# Patient Record
Sex: Female | Born: 1990 | Race: Black or African American | Hispanic: No | Marital: Married | Smoking: Former smoker
Health system: Southern US, Community
[De-identification: ages and names within clinical notes are randomized; demographics above are authoritative.]

---

## 2021-04-25 ENCOUNTER — Emergency Department (HOSPITAL_BASED_OUTPATIENT_CLINIC_OR_DEPARTMENT_OTHER): Payer: Medicaid Other

## 2021-04-25 ENCOUNTER — Other Ambulatory Visit: Payer: Self-pay

## 2021-04-25 ENCOUNTER — Emergency Department (HOSPITAL_BASED_OUTPATIENT_CLINIC_OR_DEPARTMENT_OTHER)
Admission: EM | Admit: 2021-04-25 | Discharge: 2021-04-25 | Disposition: A | Discharge status: Home / Self Care | Payer: Medicaid Other | Attending: Emergency Medicine | Admitting: Emergency Medicine

## 2021-04-25 ENCOUNTER — Encounter (HOSPITAL_BASED_OUTPATIENT_CLINIC_OR_DEPARTMENT_OTHER): Payer: Self-pay

## 2021-04-25 DIAGNOSIS — Z20822 Contact with and (suspected) exposure to covid-19: Secondary | ICD-10-CM | POA: Insufficient documentation

## 2021-04-25 DIAGNOSIS — N9489 Other specified conditions associated with female genital organs and menstrual cycle: Secondary | ICD-10-CM | POA: Diagnosis not present

## 2021-04-25 DIAGNOSIS — N760 Acute vaginitis: Secondary | ICD-10-CM | POA: Diagnosis not present

## 2021-04-25 DIAGNOSIS — B9689 Other specified bacterial agents as the cause of diseases classified elsewhere: Secondary | ICD-10-CM | POA: Insufficient documentation

## 2021-04-25 DIAGNOSIS — R000 Tachycardia, unspecified: Secondary | ICD-10-CM | POA: Insufficient documentation

## 2021-04-25 DIAGNOSIS — R1031 Right lower quadrant pain: Secondary | ICD-10-CM | POA: Diagnosis present

## 2021-04-25 LAB — URINALYSIS, ROUTINE W REFLEX MICROSCOPIC
Bilirubin Urine: NEGATIVE
Glucose, UA: NEGATIVE mg/dL
Hgb urine dipstick: NEGATIVE
Ketones, ur: 40 mg/dL — AB
Nitrite: NEGATIVE
Protein, ur: NEGATIVE mg/dL
Specific Gravity, Urine: 1.025 (ref 1.005–1.030)
pH: 6 (ref 5.0–8.0)

## 2021-04-25 LAB — CBC WITH DIFFERENTIAL/PLATELET
Abs Immature Granulocytes: 0.01 10*3/uL (ref 0.00–0.07)
Basophils Absolute: 0 10*3/uL (ref 0.0–0.1)
Basophils Relative: 0 %
Eosinophils Absolute: 0 10*3/uL (ref 0.0–0.5)
Eosinophils Relative: 0 %
HCT: 33.8 % — ABNORMAL LOW (ref 36.0–46.0)
Hemoglobin: 11.2 g/dL — ABNORMAL LOW (ref 12.0–15.0)
Immature Granulocytes: 0 %
Lymphocytes Relative: 31 %
Lymphs Abs: 1.8 10*3/uL (ref 0.7–4.0)
MCH: 30.2 pg (ref 26.0–34.0)
MCHC: 33.1 g/dL (ref 30.0–36.0)
MCV: 91.1 fL (ref 80.0–100.0)
Monocytes Absolute: 0.7 10*3/uL (ref 0.1–1.0)
Monocytes Relative: 11 %
Neutro Abs: 3.4 10*3/uL (ref 1.7–7.7)
Neutrophils Relative %: 58 %
Platelets: 259 10*3/uL (ref 150–400)
RBC: 3.71 MIL/uL — ABNORMAL LOW (ref 3.87–5.11)
RDW: 13.4 % (ref 11.5–15.5)
WBC: 5.9 10*3/uL (ref 4.0–10.5)
nRBC: 0 % (ref 0.0–0.2)

## 2021-04-25 LAB — LIPASE, BLOOD: Lipase: 24 U/L (ref 11–51)

## 2021-04-25 LAB — COMPREHENSIVE METABOLIC PANEL
ALT: 8 U/L (ref 0–44)
AST: 11 U/L — ABNORMAL LOW (ref 15–41)
Albumin: 3.4 g/dL — ABNORMAL LOW (ref 3.5–5.0)
Alkaline Phosphatase: 46 U/L (ref 38–126)
Anion gap: 8 (ref 5–15)
BUN: 10 mg/dL (ref 6–20)
CO2: 24 mmol/L (ref 22–32)
Calcium: 8.6 mg/dL — ABNORMAL LOW (ref 8.9–10.3)
Chloride: 105 mmol/L (ref 98–111)
Creatinine, Ser: 0.64 mg/dL (ref 0.44–1.00)
GFR, Estimated: >60 mL/min (ref >60)
Glucose, Bld: 90 mg/dL (ref 70–99)
Potassium: 3.8 mmol/L (ref 3.5–5.1)
Sodium: 137 mmol/L (ref 135–145)
Total Bilirubin: 0.7 mg/dL (ref 0.3–1.2)
Total Protein: 7.2 g/dL (ref 6.5–8.1)

## 2021-04-25 LAB — URINALYSIS, MICROSCOPIC (REFLEX)

## 2021-04-25 LAB — RESP PANEL BY RT-PCR (FLU A&B, COVID) ARPGX2
Influenza A by PCR: NEGATIVE
Influenza B by PCR: NEGATIVE
SARS Coronavirus 2 by RT PCR: NEGATIVE

## 2021-04-25 LAB — PREGNANCY, URINE: Preg Test, Ur: NEGATIVE

## 2021-04-25 MED ORDER — MORPHINE SULFATE (PF) 4 MG/ML IV SOLN
4.0000 mg | Freq: Once | INTRAVENOUS | Status: AC
  Administered 2021-04-25: 4 mg via INTRAVENOUS
  Filled 2021-04-25: qty 1

## 2021-04-25 MED ORDER — HYDROMORPHONE HCL 1 MG/ML IJ SOLN: 1.0000 mg | Freq: Once | INTRAMUSCULAR | Status: DC

## 2021-04-25 MED ORDER — IOHEXOL 300 MG/ML  SOLN
100.0000 mL | Freq: Once | INTRAMUSCULAR | Status: AC | PRN
  Administered 2021-04-25: 100 mL via INTRAVENOUS

## 2021-04-25 MED ORDER — SODIUM CHLORIDE 0.9 % IV BOLUS
1000.0000 mL | Freq: Once | INTRAVENOUS | Status: AC
  Administered 2021-04-25: 1000 mL via INTRAVENOUS

## 2021-04-25 MED ORDER — HYDROCODONE-ACETAMINOPHEN 5-325 MG PO TABS
2.0000 | ORAL_TABLET | ORAL | 0 refills | Status: DC | PRN
Start: 2021-04-25 — End: 2021-06-13

## 2021-04-25 MED ORDER — ONDANSETRON HCL 4 MG/2ML IJ SOLN
4.0000 mg | Freq: Once | INTRAMUSCULAR | Status: AC
  Administered 2021-04-25: 4 mg via INTRAVENOUS
  Filled 2021-04-25: qty 2

## 2021-04-25 MED ORDER — METRONIDAZOLE 500 MG PO TABS: 500.0000 mg | ORAL_TABLET | Freq: Two times a day (BID) | ORAL | 0 refills | Status: AC

## 2021-04-25 NOTE — Discharge Instructions (Signed)
Your work-up today was reassuring.  He did have some signs of bacterial vaginosis in your work-up with OBG earlier today so I prescribed you an antibiotic, take it twice daily for 7 days. Take the Norco for severe abdominal pain, try to use it sparingly. Call your gynecologist tomorrow, tell them you were seen today in the ED and your CT showed pelvic congestion syndrome.  See if they want to reevaluate you or have any additional recommendations.

## 2021-04-25 NOTE — ED Notes (Signed)
Pt does not have to void as this time

## 2021-04-25 NOTE — ED Provider Notes (Signed)
[facility] EMERGENCY DEPARTMENT Provider Note   CSN: [id number] Arrival date & time: 04/25/21  1643     History  Chief Complaint  Patient presents with   Abdominal Pain    Robin Bradley is a 31 y.o. female.  HPI  Patient presents with right sided abdominal pain.  It started a week ago, it worsened severely yesterday.  It is constant, primarily to the right lower quadrant but sometimes to the right upper quadrant.  It feels sharp, there is associated nausea but no vomiting.  Moving worsens the pain, she states she has been unwilling to eat secondary to nausea.  Not vomiting, no diarrhea.  Last bowel movement was earlier today, no dysuria or hematuria.  Was seen earlier today by your OB gynecologist, she tested positive for clue cells but was not started on Flagyl.  Patient was seen [facility] at [facility], she had lab work-up done at that time which was unremarkable.  Patient was seen by her OB/GYN earlier this morning.  The eye OB/GYN advised to go to the ED for additional work-up given the pain is upper as well as lower.  She had a routine pelvic which is notable for clue cells.  Has not been started on antibiotics at this time.  Past surgical history is notable for 2 previous C-sections, denies any other surgeries to the abdomen.   Home Medications Prior to Admission medications   Medication Sig Start Date End Date Taking? Authorizing Provider  HYDROcodone-acetaminophen (NORCO/VICODIN) 5-325 MG tablet Take 2 tablets by mouth every 4 (four) hours as needed. 04/25/21  Yes Sherrill Raring, PA-C  metroNIDAZOLE (FLAGYL) 500 MG tablet Take 1 tablet (500 mg total) by mouth 2 (two) times daily. 04/25/21  Yes Sherrill Raring, PA-C      Allergies    Patient has no known allergies.    Review of Systems   Review of Systems  Constitutional:  Negative for fever.  Respiratory:  Positive for shortness of breath.   Cardiovascular:  Negative for chest pain.  Gastrointestinal:   Positive for abdominal pain, constipation and nausea. Negative for vomiting.  Genitourinary:  Negative for dysuria, flank pain, vaginal discharge and vaginal pain.  Musculoskeletal:  Negative for gait problem.  Skin:  Negative for wound.  Neurological:  Negative for syncope.   Physical Exam Updated Vital Signs BP 105/63 (BP Location: Right Arm)    Pulse 65    Temp 99 F (37.2 C) (Oral)    Resp 18    Ht 5\' 1"  (1.549 m)    Wt 77.6 kg    LMP 03/31/2021    SpO2 100%    BMI 32.31 kg/m  Physical Exam Vitals and nursing note reviewed. Exam conducted with a chaperone present.  Constitutional:      Appearance: Normal appearance.  HENT:     Head: Normocephalic and atraumatic.  Eyes:     General: No scleral icterus.       Right eye: No discharge.        Left eye: No discharge.     Extraocular Movements: Extraocular movements intact.     Pupils: Pupils are equal, round, and reactive to light.  Cardiovascular:     Rate and Rhythm: Regular rhythm. Tachycardia present.     Pulses: Normal pulses.     Heart sounds: Normal heart sounds. No murmur heard.   No friction rub. No gallop.     Comments: S1-S2, mildly tachycardic Pulmonary:     Effort: Pulmonary effort is  normal. No respiratory distress.     Breath sounds: Normal breath sounds.  Abdominal:     General: Abdomen is flat. Bowel sounds are normal. There is no distension.     Palpations: Abdomen is soft.     Tenderness: There is abdominal tenderness in the right upper quadrant and right lower quadrant. There is rebound. There is no right CVA tenderness or left CVA tenderness. Positive signs include McBurney's sign. Negative signs include Murphy's sign.     Comments: Abdomen is soft, right lower quadrant tenderness as well as rebound tenderness with palpation to the left lower quadrant.  No CVA tenderness, slight right upper quadrant tenderness but negative Murphy.  Skin:    General: Skin is warm and dry.     Coloration: Skin is not  jaundiced.  Neurological:     Mental Status: She is alert. Mental status is at baseline.     Coordination: Coordination normal.   ED Results / Procedures / Treatments   Labs (all labs ordered are listed, but only abnormal results are displayed) Labs Reviewed  URINALYSIS, ROUTINE W REFLEX MICROSCOPIC - Abnormal; Notable for the following components:      Result Value   Ketones, ur 40 (*)    Leukocytes,Ua TRACE (*)    All other components within normal limits  COMPREHENSIVE METABOLIC PANEL - Abnormal; Notable for the following components:   Calcium 8.6 (*)    Albumin 3.4 (*)    AST 11 (*)    All other components within normal limits  CBC WITH DIFFERENTIAL/PLATELET - Abnormal; Notable for the following components:   RBC 3.71 (*)    Hemoglobin 11.2 (*)    HCT 33.8 (*)    All other components within normal limits  URINALYSIS, MICROSCOPIC (REFLEX) - Abnormal; Notable for the following components:   Bacteria, UA FEW (*)    All other components within normal limits  RESP PANEL BY RT-PCR (FLU A&B, COVID) ARPGX2  PREGNANCY, URINE  LIPASE, BLOOD    EKG None  Radiology CT ABDOMEN PELVIS W CONTRAST  Result Date: 04/25/2021 CLINICAL DATA:  Right lower quadrant abdominal pain beginning 1 week ago. EXAM: CT ABDOMEN AND PELVIS WITH CONTRAST TECHNIQUE: Multidetector CT imaging of the abdomen and pelvis was performed using the standard protocol following bolus administration of intravenous contrast. CONTRAST:  136mL OMNIPAQUE IOHEXOL 300 MG/ML  SOLN COMPARISON:  None. FINDINGS: Lower chest: Small right pleural effusion with mild right base atelectasis, etiology unknown. Hepatobiliary: Normal Pancreas: Normal Spleen: Normal Adrenals/Urinary Tract: Adrenal glands are normal. Kidneys are normal. Bladder is normal. Stomach/Bowel: Stomach and small intestine are normal. The appendix is normal. No colon abnormality seen. Vascular/Lymphatic: No abnormal vascular finding.  No adenopathy. Reproductive:  Uterus appears normal. Prominent vascularity of the ovaries, particularly on the right, suggesting possible pelvic congestion syndrome. Other: Small amount of free fluid in the pelvic cul-de-sac, often seen in females of this age. Musculoskeletal: Normal IMPRESSION: Normal appearing appendix. Prominent vascularity of the adnexal regions, particularly the right, suggesting pelvic congestion syndrome. Small right pleural effusion with mild right base atelectasis, etiology unknown. Electronically Signed   By: Nelson Chimes M.D.   On: 04/25/2021 18:45    Procedures Procedures    Medications Ordered in ED Medications  sodium chloride 0.9 % bolus 1,000 mL (0 mLs Intravenous Stopped 04/25/21 1858)  ondansetron (ZOFRAN) injection 4 mg (4 mg Intravenous Given 04/25/21 1734)  morphine 4 MG/ML injection 4 mg (4 mg Intravenous Given 04/25/21 1734)  iohexol (OMNIPAQUE) 300 MG/ML  solution 100 mL (100 mLs Intravenous Contrast Given 04/25/21 1821)  morphine 4 MG/ML injection 4 mg (4 mg Intravenous Given 04/25/21 1857)    ED Course/ Medical Decision Making/ A&P                           Medical Decision Making  This is a 31 year old female presenting with right-sided abdominal pain.  She is not technically febrile but has an elevated temperature at 99 degrees normal.  She has right lower quadrant tenderness as well as rebound tenderness concerning for possible appendicitis.  We will keep n.p.o. and proceed with CT.   Additional History and Prior Lab per chart review: Patient's ED visit yesterday was reviewed on care everywhere, I cannot see the provider note but I can see the lab work-up.  There is no AKI or gross electrolyte derangement.  Her potassium was slightly low at 3.4 but not clinically pertinent.  She was not pregnant, no evidence of a UTI on her urinalysis.  Additionally, there is no evidence of a leukocytosis on the CBC.  Patient work-up: There is no leukocytosis on CBC, patient is mildly anemic at  11.2 but stable.  Additionally, no signs of hypotension or tachycardia concerning for acute blood loss. CMP: Hypokalemia as yesterday, mild hypocalcemia but not clinically pertinent or contributable to her current presentation.  There is no evidence of AKI, no LFT elevation concerning for an acute hepatic injury or cholestatic injury. Lipase: Negative, additionally patient presentation is not consistent with pancreatitis. UA: No evidence of a UTI Not preg  Imaging: CT ABD/PELVIS with contrast. -Imaging results reviewed by myself personally and I agree with the radiologist impression.. -CT abdomen pelvis is notable for  Normal appearing appendix.     Prominent vascularity of the adnexal regions, particularly the  right, suggesting pelvic congestion syndrome.     Small right pleural effusion with mild right base atelectasis,  etiology unknown.   Patient's lungs were clear to auscultation.  Her vitals are stable, she does feel short of breath secondary to pain but it does not appear to be in dependent shortness of breath.  Do not feel she needs isolated chest x-ray at this time.  Discussed the results with the patient including the suggestion of pelvic congestion syndrome.  Have advised her to follow-up with her gynecologist.  She verbalized understanding and will call them tomorrow morning.  I will prescribe her Flagyl given the clue cells on her wet prep earlier today, will also discharge with short course of narcotics for pain.  Do not feel she needs emergent ultrasound at this time.  Her pain has improved, her vitals remained stable.  Patient was discharged in stable condition.        Final Clinical Impression(s) / ED Diagnoses Final diagnoses:  BV (bacterial vaginosis)  Pelvic congestion syndrome    Rx / DC Orders ED Discharge Orders          Ordered    metroNIDAZOLE (FLAGYL) 500 MG tablet  2 times daily        04/25/21 1937    HYDROcodone-acetaminophen (NORCO/VICODIN) 5-325  MG tablet  Every 4 hours PRN        04/25/21 1937              Sherrill Raring, Hershal Coria 04/25/21 1941    Gareth Morgan, MD 04/26/21 1124

## 2021-04-25 NOTE — ED Triage Notes (Signed)
Pt c/o right side abd pain started 1/5-denies n/v/d-states she was seen/[facility] ED yesterday-NAD-steady gait

## 2021-06-13 ENCOUNTER — Telehealth (HOSPITAL_BASED_OUTPATIENT_CLINIC_OR_DEPARTMENT_OTHER): Payer: Self-pay | Admitting: Emergency Medicine

## 2021-06-13 ENCOUNTER — Other Ambulatory Visit: Payer: Self-pay

## 2021-06-13 ENCOUNTER — Emergency Department (HOSPITAL_BASED_OUTPATIENT_CLINIC_OR_DEPARTMENT_OTHER): Payer: Medicaid Other

## 2021-06-13 ENCOUNTER — Encounter (HOSPITAL_BASED_OUTPATIENT_CLINIC_OR_DEPARTMENT_OTHER): Payer: Self-pay

## 2021-06-13 ENCOUNTER — Emergency Department (HOSPITAL_BASED_OUTPATIENT_CLINIC_OR_DEPARTMENT_OTHER)
Admission: EM | Admit: 2021-06-13 | Discharge: 2021-06-13 | Disposition: A | Discharge status: Home / Self Care | Payer: Medicaid Other | Attending: Emergency Medicine | Admitting: Emergency Medicine

## 2021-06-13 DIAGNOSIS — R000 Tachycardia, unspecified: Secondary | ICD-10-CM | POA: Insufficient documentation

## 2021-06-13 DIAGNOSIS — M25552 Pain in left hip: Secondary | ICD-10-CM | POA: Diagnosis present

## 2021-06-13 MED ORDER — HYDROCODONE-ACETAMINOPHEN 5-325 MG PO TABS: 1.0000 | ORAL_TABLET | Freq: Four times a day (QID) | ORAL | 0 refills | Status: AC | PRN

## 2021-06-13 MED ORDER — NAPROXEN 375 MG PO TABS: 375.0000 mg | ORAL_TABLET | Freq: Two times a day (BID) | ORAL | 0 refills | Status: AC

## 2021-06-13 MED ORDER — HYDROCODONE-ACETAMINOPHEN 5-325 MG PO TABS: 1.0000 | ORAL_TABLET | Freq: Four times a day (QID) | ORAL | 0 refills | Status: DC | PRN

## 2021-06-13 MED ORDER — NAPROXEN 250 MG PO TABS
500.0000 mg | ORAL_TABLET | Freq: Once | ORAL | Status: AC
Start: 2021-06-13 — End: 2021-06-13
  Administered 2021-06-13: 500 mg via ORAL
  Filled 2021-06-13: qty 2

## 2021-06-13 MED ORDER — HYDROCODONE-ACETAMINOPHEN 5-325 MG PO TABS
1.0000 | ORAL_TABLET | Freq: Once | ORAL | Status: AC
Start: 2021-06-13 — End: 2021-06-13
  Administered 2021-06-13: 1 via ORAL
  Filled 2021-06-13: qty 1

## 2021-06-13 NOTE — Telephone Encounter (Signed)
Patient called back stating pharmacy she requested is out of medication. Requesting pharmacy change.  ?

## 2021-06-13 NOTE — ED Triage Notes (Signed)
Pt arrives with c/o pain to left hip denies any falls or injury. Reports hip is sore to touch and hurts with movement. States she noticed the pain 2 days ago and got worse yesterday. Has been using tylenol and heating pad.  ?

## 2021-06-13 NOTE — ED Provider Notes (Signed)
?[facility] EMERGENCY DEPARTMENT ?Provider Note ? ? ?CSN: [id number] ?Arrival date & time: 06/13/21  0754 ? ?  ? ?History ? ?Chief Complaint  ?Patient presents with  ? Hip Pain  ? ? ?Robin Bradley is a 31 y.o. female with no provided medical history.  Patient states that 2 days ago she began experiencing left hip pain that acutely worsened last night.  Patient denies any preceding trauma, event that could contribute to the pain.  Patient states that pain is constant and is described as a "pulled muscle, like I broke something.  It feels like it is throbbing".  Patient states that she has taken Tylenol with no relief of pain.  Patient denies history of blood clots, smoking, unilateral leg swelling or hemoptysis.  Patient denies radiation of pain into the left leg, back pain.  Patient denies fevers, nausea, vomiting. ? ? ?Hip Pain ? ? ?  ? ?Home Medications ?Prior to Admission medications   ?Medication Sig Start Date End Date Taking? Authorizing Provider  ?HYDROcodone-acetaminophen (NORCO/VICODIN) 5-325 MG tablet Take 1 tablet by mouth every 6 (six) hours as needed for moderate pain or severe pain. 06/13/21  Yes Al Decant, PA-C  ?naproxen (NAPROSYN) 375 MG tablet Take 1 tablet (375 mg total) by mouth 2 (two) times daily. 06/13/21  Yes Al Decant, PA-C  ?metroNIDAZOLE (FLAGYL) 500 MG tablet Take 1 tablet (500 mg total) by mouth 2 (two) times daily. 04/25/21   Theron Arista, PA-C  ?   ? ?Allergies    ?Patient has no known allergies.   ? ?Review of Systems   ?Review of Systems  ?Constitutional:  Negative for chills and fever.  ?Respiratory:  Negative for cough.   ?Cardiovascular:  Negative for leg swelling.  ?Gastrointestinal:  Negative for nausea and vomiting.  ?Musculoskeletal:  Positive for arthralgias. Negative for back pain.  ?All other systems reviewed and are negative. ? ?Physical Exam ?Updated Vital Signs ?BP 125/81 (BP Location: Right Arm)   Pulse (!) 111   Temp 97.8 ?F (36.6 ?C) (Oral)    Resp 18   Ht 5\' 1"  (1.549 m)   Wt 81.6 kg   LMP 06/13/2021   SpO2 99%   BMI 34.01 kg/m?  ?Physical Exam ?Vitals and nursing note reviewed.  ?Constitutional:   ?   General: She is not in acute distress. ?   Appearance: She is not ill-appearing, toxic-appearing or diaphoretic.  ?HENT:  ?   Head: Normocephalic and atraumatic.  ?   Nose: Nose normal.  ?   Mouth/Throat:  ?   Mouth: Mucous membranes are moist.  ?Eyes:  ?   Extraocular Movements: Extraocular movements intact.  ?   Conjunctiva/sclera: Conjunctivae normal.  ?   Pupils: Pupils are equal, round, and reactive to light.  ?Cardiovascular:  ?   Rate and Rhythm: Regular rhythm. Tachycardia present.  ?   Pulses:     ?     Dorsalis pedis pulses are 2+ on the right side and 2+ on the left side.  ?Pulmonary:  ?   Effort: Pulmonary effort is normal.  ?   Breath sounds: Normal breath sounds. No wheezing.  ?Abdominal:  ?   General: Abdomen is flat. There is no distension.  ?   Palpations: Abdomen is soft.  ?   Tenderness: There is no abdominal tenderness. There is no guarding.  ?Musculoskeletal:  ?   Cervical back: Normal range of motion and neck supple. No tenderness.  ?   Right hip:  Normal.  ?   Left hip: Tenderness present. No deformity or lacerations. Decreased range of motion.  ?   Right lower leg: No edema.  ?   Left lower leg: No edema.  ?   Comments: Diffuse tenderness over the lateral side of left hip.  There is no crepitus, deformity noted.  There is no overlying erythema to indicate cellulitis.  ?Skin: ?   General: Skin is warm and dry.  ?   Capillary Refill: Capillary refill takes less than 2 seconds.  ?Neurological:  ?   General: No focal deficit present.  ?   Mental Status: She is alert and oriented to person, place, and time.  ? ? ?ED Results / Procedures / Treatments   ?Labs ?(all labs ordered are listed, but only abnormal results are displayed) ?Labs Reviewed - No data to display ? ?EKG ?None ? ?Radiology ?DG Hip Unilat W or Wo Pelvis 2-3 Views  Left ? ?Result Date: 06/13/2021 ?CLINICAL DATA:  Left lateral hip pain for the past 3 days. EXAM: DG HIP (WITH OR WITHOUT PELVIS) 2-3V LEFT COMPARISON:  None. FINDINGS: There is no evidence of hip fracture or dislocation. There is no evidence of arthropathy or other focal bone abnormality. Small round calcification adjacent to the greater tuberosity. IMPRESSION: 1. No acute osseous abnormality or significant degenerative changes. 2. 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 Electronically Signed   By: Obie Dredge M.D.   On: 06/13/2021 08:33   ? ?Procedures ?Procedures  ? ? ?Medications Ordered in ED ?Medications  ?naproxen (NAPROSYN) tablet 500 mg (500 mg Oral Given 06/13/21 0959)  ?HYDROcodone-acetaminophen (NORCO/VICODIN) 5-325 MG per tablet 1 tablet (1 tablet Oral Given 06/13/21 0959)  ? ? ?ED Course/ Medical Decision Making/ A&P ?  ?                        ?Medical Decision Making ?Amount and/or Complexity of Data Reviewed ?Radiology: ordered. ? ?Risk ?Prescription drug management. ? ? ?31 year old female presents to ED for evaluation of left hip pain for 2 days.  Patient denies any history of preceding trauma.  On examination, the patient's left leg is not shortened or rotated, low suspicion for hip fracture.  There is no overlying erythema to the skin of the left hip, low suspicion for cellulitis at this time.  Patient has no tenderness of the calf, lower leg so my suspicion for DVT is low at this time.  Patient has 2+ DP pulse, less than 2-second capillary refill to the left foot.  She has full range of motion to her left ankle as well as her left knee.  Patient has appropriate range of motion to left hip however it is extremely painful for her to range her hip. ? ?Plain film imaging of the left hip which I interpreted personally does show possible calcific tendinopathy of the greater tuberosity.  There is no sign of  dislocation, fracture.  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 ? ?I provided the patient with NSAID prescription as well as pain management prescription.  I have referred her to sports medicine, Dr. Jordan Likes and advised her to follow-up with him this week.  I have provided the patient with a work note in the event that she should feel her pain is too great and she is unable to work. ? ?The patient seems stable discharge  at this time.  I discussed the patient and her case with my attending Dr. Deretha Emory who is in agreement.  The patient has been provided with return precautions and she voiced understanding.  The patient had all of her questions answered to her satisfaction.  Patient is stable for discharge. ? ? ?Final Clinical Impression(s) / ED Diagnoses ?Final diagnoses:  ?Left hip pain  ? ? ?Rx / DC Orders ?ED Discharge Orders   ? ?      Ordered  ?  naproxen (NAPROSYN) 375 MG tablet  2 times daily       ? 06/13/21 1003  ?  HYDROcodone-acetaminophen (NORCO/VICODIN) 5-325 MG tablet  Every 6 hours PRN       ? 06/13/21 1003  ? ?  ?  ? ?  ? ? ?  ?Al Decant, PA-C ?06/13/21 1013 ? ?  ?Vanetta Mulders, MD ?06/15/21 1819 ? ?

## 2021-06-13 NOTE — Discharge Instructions (Addendum)
I believe the cause of your pain today is due to calcific tendinopathy. ? ?Return to ED with any new symptoms such as fevers, tender calf, leg swelling ?Please follow-up with Dr. Raeford Razor.  Please call and make an appointment today or at your earliest convenience ?Please pick up the prescription medications sent in ?Please do not drive or operate heavy machinery while taking pain medication ?You may use ice and heating pads to alleviate pain. ?I provided you with a work note in the event that your hip pain renders you unable to attend work ?

## 2021-06-24 ENCOUNTER — Ambulatory Visit: Payer: Medicaid Other | Admitting: Family Medicine

## 2021-07-02 ENCOUNTER — Ambulatory Visit: Payer: Medicaid Other | Admitting: Family Medicine

## 2022-02-02 ENCOUNTER — Encounter (HOSPITAL_BASED_OUTPATIENT_CLINIC_OR_DEPARTMENT_OTHER): Payer: Self-pay | Admitting: Emergency Medicine

## 2022-02-02 ENCOUNTER — Emergency Department (HOSPITAL_BASED_OUTPATIENT_CLINIC_OR_DEPARTMENT_OTHER)
Admission: EM | Admit: 2022-02-02 | Discharge: 2022-02-02 | Disposition: A | Discharge status: Home / Self Care | Payer: Medicaid Other | Attending: Emergency Medicine | Admitting: Emergency Medicine

## 2022-02-02 ENCOUNTER — Other Ambulatory Visit: Payer: Self-pay

## 2022-02-02 DIAGNOSIS — M5441 Lumbago with sciatica, right side: Secondary | ICD-10-CM

## 2022-02-02 DIAGNOSIS — X500XXA Overexertion from strenuous movement or load, initial encounter: Secondary | ICD-10-CM | POA: Diagnosis not present

## 2022-02-02 DIAGNOSIS — M545 Low back pain, unspecified: Secondary | ICD-10-CM | POA: Diagnosis present

## 2022-02-02 LAB — URINALYSIS, ROUTINE W REFLEX MICROSCOPIC
Bilirubin Urine: NEGATIVE
Glucose, UA: NEGATIVE mg/dL
Hgb urine dipstick: NEGATIVE
Ketones, ur: NEGATIVE mg/dL
Nitrite: NEGATIVE
Protein, ur: NEGATIVE mg/dL
Specific Gravity, Urine: 1.025 (ref 1.005–1.030)
pH: 6 (ref 5.0–8.0)

## 2022-02-02 LAB — URINALYSIS, MICROSCOPIC (REFLEX): RBC / HPF: NONE SEEN RBC/hpf (ref 0–5)

## 2022-02-02 LAB — PREGNANCY, URINE: Preg Test, Ur: NEGATIVE

## 2022-02-02 MED ORDER — METHYLPREDNISOLONE 4 MG PO TBPK: ORAL_TABLET | ORAL | 0 refills | Status: AC

## 2022-02-02 MED ORDER — CYCLOBENZAPRINE HCL 10 MG PO TABS
10.0000 mg | ORAL_TABLET | Freq: Two times a day (BID) | ORAL | 0 refills | Status: AC | PRN
Start: 2022-02-02 — End: ?

## 2022-02-02 MED ORDER — CYCLOBENZAPRINE HCL 10 MG PO TABS
10.0000 mg | ORAL_TABLET | Freq: Once | ORAL | Status: AC
  Administered 2022-02-02: 10 mg via ORAL
  Filled 2022-02-02: qty 1

## 2022-02-02 NOTE — ED Notes (Signed)
Back pain lower rt side of back . Denies any injury. Denies any dysuria

## 2022-02-02 NOTE — Discharge Instructions (Addendum)
Take Flexeril, muscle relaxant as prescribed.  This medication is sedating so do not use while driving or doing any other dangerous activities.  Do not mix with alcohol or drugs.  I have also prescribed you steroids, take first dose when you pick up your prescription.  Follow package instructions.  Recommend 1000 mg of Tylenol every 6 hours as needed for pain as well.  Once you complete your steroids it is okay to take ibuprofen 400 mg every 8 hours as needed for pain as well.

## 2022-02-02 NOTE — ED Provider Notes (Signed)
[facility] Provider Note   CSN: [id number] Arrival date & time: 02/02/22  0636     History  Chief Complaint  Patient presents with   Back Pain    Robin Bradley is a 31 y.o. female.  Patient here with right-sided lower back pain that started over the last few days.  Does a lot of repetitive action, lifting for work.  Has had some pain in her right buttock sometimes into the right thigh.  Denies any loss of bowel or bladder.  No difficulty urinating.  No vaginal bleeding or discharge.  No numbness or weakness.  Denies any fever, chills.  No other significant trauma.  Last menstrual cycles was a few weeks ago.  Denies any pain with urination.  No flank pain.  No history of kidney stones.  The history is provided by the patient.       Home Medications Prior to Admission medications   Medication Sig Start Date End Date Taking? Authorizing Provider  cyclobenzaprine (FLEXERIL) 10 MG tablet Take 1 tablet (10 mg total) by mouth 2 (two) times daily as needed for muscle spasms. 02/02/22  Yes Prayan Ulin, DO  methylPREDNISolone (MEDROL DOSEPAK) 4 MG TBPK tablet Follow package insert 02/02/22  Yes Lanasia Porras, DO  HYDROcodone-acetaminophen (NORCO/VICODIN) 5-325 MG tablet Take 1 tablet by mouth every 6 (six) hours as needed for severe pain. 06/13/21   Azucena Cecil, PA-C  metroNIDAZOLE (FLAGYL) 500 MG tablet Take 1 tablet (500 mg total) by mouth 2 (two) times daily. 04/25/21   Sherrill Raring, PA-C  naproxen (NAPROSYN) 375 MG tablet Take 1 tablet (375 mg total) by mouth 2 (two) times daily. 06/13/21   Azucena Cecil, PA-C      Allergies    Patient has no known allergies.    Review of Systems   Review of Systems  Physical Exam Updated Vital Signs  ED Triage Vitals  Enc Vitals Group     BP 02/02/22 0643 118/75     Pulse Rate 02/02/22 0643 (!) 103     Resp 02/02/22 0643 16     Temp 02/02/22 0643 98.3 F (36.8 C)     Temp Source 02/02/22  0643 Oral     SpO2 02/02/22 0643 100 %     Weight 02/02/22 0644 180 lb (81.6 kg)     Height 02/02/22 0644 5\' 1"  (1.549 m)     Head Circumference --      Peak Flow --      Pain Score 02/02/22 0643 10     Pain Loc --      Pain Edu? --      Excl. in [place]? --     Physical Exam Vitals and nursing note reviewed.  Constitutional:      General: She is not in acute distress.    Appearance: She is well-developed. She is not ill-appearing.  HENT:     Head: Normocephalic and atraumatic.     Nose: Nose normal.     Mouth/Throat:     Mouth: Mucous membranes are moist.  Eyes:     Extraocular Movements: Extraocular movements intact.     Conjunctiva/sclera: Conjunctivae normal.     Pupils: Pupils are equal, round, and reactive to light.  Cardiovascular:     Rate and Rhythm: Normal rate and regular rhythm.     Pulses: Normal pulses.     Heart sounds: Normal heart sounds. No murmur heard. Pulmonary:     Effort: Pulmonary effort is  normal. No respiratory distress.     Breath sounds: Normal breath sounds.  Abdominal:     Palpations: Abdomen is soft.     Tenderness: There is no abdominal tenderness.  Musculoskeletal:        General: Tenderness present. No swelling.     Cervical back: Normal range of motion and neck supple.     Comments: No midline spinal tenderness, tenderness to the paraspinal lumbar muscles on the right  Skin:    General: Skin is warm and dry.     Capillary Refill: Capillary refill takes less than 2 seconds.  Neurological:     General: No focal deficit present.     Mental Status: She is alert.     Sensory: No sensory deficit.     Motor: No weakness.     Comments: 5+ out of 5 strength throughout, normal sensation, no drift  Psychiatric:        Mood and Affect: Mood normal.     ED Results / Procedures / Treatments   Labs (all labs ordered are listed, but only abnormal results are displayed) Labs Reviewed  PREGNANCY, URINE  URINALYSIS, ROUTINE W REFLEX MICROSCOPIC     EKG None  Radiology No results found.  Procedures Procedures    Medications Ordered in ED Medications  cyclobenzaprine (FLEXERIL) tablet 10 mg (has no administration in time range)    ED Course/ Medical Decision Making/ A&P                           Medical Decision Making Amount and/or Complexity of Data Reviewed Labs: ordered.  Risk Prescription drug management.   Robin Bradley is here with right-sided low back pain.  Normal vitals.  No fever.  Differential diagnosis is likely sciatic pain.  I have no concern for cauda equina or epidural abscess.  There acute spinal injury or process.  Urinalysis was collected was negative for UTI per my review and interpretation.  Pregnancy test is negative.  Overall we will treat with Medrol Dosepak, Tylenol, Flexeril.  No symptoms that are concerning or significant trauma process.  No need for imaging at this time.  We will have her follow-up with primary care doctor.  Discharged in good condition.  This chart was dictated using voice recognition software.  Despite best efforts to proofread,  errors can occur which can change the documentation meaning.         Final Clinical Impression(s) / ED Diagnoses Final diagnoses:  Acute right-sided low back pain with right-sided sciatica    Rx / DC Orders ED Discharge Orders          Ordered    cyclobenzaprine (FLEXERIL) 10 MG tablet  2 times daily PRN        02/02/22 0720    methylPREDNISolone (MEDROL DOSEPAK) 4 MG TBPK tablet        02/02/22 0720              Virgina Norfolk, DO 02/02/22 0720

## 2022-02-02 NOTE — ED Triage Notes (Signed)
Patient c/o lower back pain that started yesterday, worsened this morning upon waking.  Patient denies trauma, falls, urinary symptoms, bowel/bladder loss.

## 2022-04-27 ENCOUNTER — Emergency Department (HOSPITAL_BASED_OUTPATIENT_CLINIC_OR_DEPARTMENT_OTHER)
Admission: EM | Admit: 2022-04-27 | Discharge: 2022-04-27 | Disposition: A | Discharge status: Home / Self Care | Payer: Medicaid Other | Attending: Emergency Medicine | Admitting: Emergency Medicine

## 2022-04-27 ENCOUNTER — Emergency Department (HOSPITAL_BASED_OUTPATIENT_CLINIC_OR_DEPARTMENT_OTHER): Payer: Medicaid Other

## 2022-04-27 ENCOUNTER — Encounter (HOSPITAL_BASED_OUTPATIENT_CLINIC_OR_DEPARTMENT_OTHER): Payer: Self-pay | Admitting: Emergency Medicine

## 2022-04-27 ENCOUNTER — Other Ambulatory Visit: Payer: Self-pay

## 2022-04-27 DIAGNOSIS — M25571 Pain in right ankle and joints of right foot: Secondary | ICD-10-CM | POA: Diagnosis present

## 2022-04-27 DIAGNOSIS — M7989 Other specified soft tissue disorders: Secondary | ICD-10-CM | POA: Insufficient documentation

## 2022-04-27 MED ORDER — IBUPROFEN 400 MG PO TABS
600.0000 mg | ORAL_TABLET | Freq: Once | ORAL | Status: AC
  Administered 2022-04-27: 600 mg via ORAL
  Filled 2022-04-27: qty 1

## 2022-04-27 MED ORDER — IBUPROFEN 600 MG PO TABS: 600.0000 mg | ORAL_TABLET | Freq: Four times a day (QID) | ORAL | 0 refills | Status: AC | PRN

## 2022-04-27 NOTE — Discharge Instructions (Addendum)
Today for right foot and ankle pain.  The area for pain suggest you might have Achilles tendinitis.  You are given an ankle boot and crutches.  Rest the area, use ice as directed and use the ibuprofen.  Follow-up with orthopedics.  Come back to the ER if you have new or worsening symptoms.

## 2022-04-27 NOTE — ED Triage Notes (Signed)
Pt arrives pov, limping gait with c/o RT heel and achilles pain upon waking today. Denies injury

## 2022-04-27 NOTE — ED Provider Notes (Signed)
[facility] Provider Note   CSN: [id number] Arrival date & time: 04/27/22  0849     History  Chief Complaint  Patient presents with   Foot Pain    Robin Bradley is a 32 y.o. female.  She complaining of pain to the left heel that started last night became more severe this morning.  She has no trauma.  She reports she is on her feet a lot for work.  She states she got up today and noticed some swelling to the bottom of her right heel and could not bear full weight.  She tried putting on elastic brace without relief.  She tried to get to work but was unable to finish and comes for further evaluation.  Never had this pain in the past.  Pain is most severe in the Achilles tendon, denies recent antibiotic use specifically recent fluoroquinolone use.   Foot Pain       Home Medications Prior to Admission medications   Medication Sig Start Date End Date Taking? Authorizing Provider  ibuprofen (ADVIL) 600 MG tablet Take 1 tablet (600 mg total) by mouth every 6 (six) hours as needed. 04/27/22  Yes Omair Dettmer A, PA-C  cyclobenzaprine (FLEXERIL) 10 MG tablet Take 1 tablet (10 mg total) by mouth 2 (two) times daily as needed for muscle spasms. 02/02/22   Curatolo, Adam, DO  HYDROcodone-acetaminophen (NORCO/VICODIN) 5-325 MG tablet Take 1 tablet by mouth every 6 (six) hours as needed for severe pain. 06/13/21   Azucena Cecil, PA-C  methylPREDNISolone (MEDROL DOSEPAK) 4 MG TBPK tablet Follow package insert 02/02/22   Curatolo, Adam, DO  metroNIDAZOLE (FLAGYL) 500 MG tablet Take 1 tablet (500 mg total) by mouth 2 (two) times daily. 04/25/21   Sherrill Raring, PA-C  naproxen (NAPROSYN) 375 MG tablet Take 1 tablet (375 mg total) by mouth 2 (two) times daily. 06/13/21   Azucena Cecil, PA-C      Allergies    Patient has no known allergies.    Review of Systems   Review of Systems  Musculoskeletal:        Pain right foot    Physical Exam Updated Vital  Signs BP 119/75   Pulse 84   Temp 98.5 F (36.9 C) (Oral)   Resp 18   Ht 5\' 1"  (1.549 m)   Wt 83 kg   LMP 04/05/2022   SpO2 100%   BMI 34.58 kg/m  Physical Exam Vitals and nursing note reviewed.  Constitutional:      General: She is not in acute distress.    Appearance: She is well-developed.  HENT:     Head: Normocephalic and atraumatic.  Eyes:     Conjunctiva/sclera: Conjunctivae normal.  Cardiovascular:     Rate and Rhythm: Normal rate and regular rhythm.     Heart sounds: No murmur heard. Pulmonary:     Effort: Pulmonary effort is normal. No respiratory distress.     Breath sounds: Normal breath sounds.  Musculoskeletal:        General: No swelling.     Cervical back: Neck supple.     Right ankle:     Right Achilles Tendon: Tenderness present. No defects. Thompson's test negative.     Left ankle: Normal.     Comments: Mild swelling noted to plantar aspect of right heel  Skin:    General: Skin is warm and dry.     Capillary Refill: Capillary refill takes less than 2 seconds.  Neurological:  Mental Status: She is alert.  Psychiatric:        Mood and Affect: Mood normal.     ED Results / Procedures / Treatments   Labs (all labs ordered are listed, but only abnormal results are displayed) Labs Reviewed - No data to display  EKG None  Radiology DG Foot Complete Right  Result Date: 04/27/2022 CLINICAL DATA:  Patient with RT heel and achilles pain upon waking today. Denies injury EXAM: RIGHT ANKLE - COMPLETE 3+ VIEW; RIGHT FOOT COMPLETE - 3+ VIEW COMPARISON:  None Available. FINDINGS: There is no evidence of fracture or dislocation in the right ankle or foot. No ankle joint effusion. Small calcaneal spurs. There is no evidence of arthropathy or other focal bone abnormality. Soft tissues are unremarkable. IMPRESSION: No acute finding in the right ankle or foot. Electronically Signed   By: Audie Pinto M.D.   On: 04/27/2022 09:48   DG Ankle Complete  Right  Result Date: 04/27/2022 CLINICAL DATA:  Patient with RT heel and achilles pain upon waking today. Denies injury EXAM: RIGHT ANKLE - COMPLETE 3+ VIEW; RIGHT FOOT COMPLETE - 3+ VIEW COMPARISON:  None Available. FINDINGS: There is no evidence of fracture or dislocation in the right ankle or foot. No ankle joint effusion. Small calcaneal spurs. There is no evidence of arthropathy or other focal bone abnormality. Soft tissues are unremarkable. IMPRESSION: No acute finding in the right ankle or foot. Electronically Signed   By: Audie Pinto M.D.   On: 04/27/2022 09:48    Procedures Procedures    Medications Ordered in ED Medications  ibuprofen (ADVIL) tablet 600 mg (has no administration in time range)    ED Course/ Medical Decision Making/ A&P                             Medical Decision Making This patient presents to the ED for concern of right Achilles pain, this involves an extensive number of treatment options, and is a complaint that carries with it a high risk of complications and morbidity.  The differential diagnosis includes fracture, tendinitis, tendon rupture, cellulitis, sprain, strain, contusion,  Imaging Studies ordered:  I ordered imaging studies including x-ray right foot and ankle I independently visualized and interpreted imaging which showed spurs noted no fracture or dislocation I agree with the radiologist interpretation      Problem List / ED Course / Critical interventions / Medication management  Right ankle pain-likely Achilles tendinitis.  Patient has tenderness at the Achilles tenderness no defect, there is very mild right heel swelling.  She can only bear partial weight.  No injury or trauma, function is intact.  I discussed with patient this is likely her presenting tendinitis.  Will treat with weightbearing as tolerated, supportive boot NSAIDs and orthopedic follow-up.  Advised strict return precautions.  She is agreeable with plan of care and  discharge.  She has no signs of rupture of the tendon, do not feel further imaging is warranted at this time I ordered medication including ibuprofen for pain  I have reviewed the patients home medicines and have made adjustments as needed       Amount and/or Complexity of Data Reviewed Radiology: ordered.  Risk Prescription drug management.      Final Clinical Impression(s) / ED Diagnoses Final diagnoses:  Acute right ankle pain    Rx / DC Orders ED Discharge Orders          Ordered  ibuprofen (ADVIL) 600 MG tablet  Every 6 hours PRN        04/27/22 [address], PA-C 04/27/22 1111    Virgina Norfolk, DO 04/27/22 1243

## 2022-04-30 ENCOUNTER — Ambulatory Visit (INDEPENDENT_AMBULATORY_CARE_PROVIDER_SITE_OTHER): Payer: Medicaid Other | Admitting: Physician Assistant

## 2022-04-30 ENCOUNTER — Encounter: Payer: Self-pay | Admitting: Physician Assistant

## 2022-04-30 DIAGNOSIS — M722 Plantar fascial fibromatosis: Secondary | ICD-10-CM

## 2022-04-30 NOTE — Progress Notes (Signed)
Office Visit Note   Patient: Robin Bradley           Date of Birth: 1990-07-19           MRN: [id number] Visit Date: 04/30/2022              Requested by: No referring provider defined for this encounter. PCP: Pcp, No   Assessment & Plan: Visit Diagnoses: Plantar fasciitis right foot  Plan: Patient is a pleasant 32 year old woman whose had on and off problems with the bottom of her right foot and the heel and on the posterior heel.  She awoke over the weekend and stepped out of bed and could not put her heel on the ground because it was very painful.  Since then every time she sits for a while to get up and place weight on her foot causes the pain to recur.  She was seen in the emergency room where x-rays demonstrated calcaneal spurring.  She was told to use a cam walker boot but because of her job thought an ankle brace would be more effective.  She says it is a little bit better.  She has had a little improvement.  History and examination consistent with plantar fascia rupture.  We discussed the natural history of this.  I do think a boot would be more supportive but I have also given her foot pads to put in the boot to support her arch.  She does have some pes planus.  Also have given her some plantar fascia stretching exercises to try.  For now she can go back to work tomorrow but should do light duty.  Will follow-up in 3 weeks  Follow-Up Instructions: Return in about 3 weeks (around 05/21/2022).   Orders:  No orders of the defined types were placed in this encounter.  No orders of the defined types were placed in this encounter.     Procedures: No procedures performed   Clinical Data: No additional findings.   Subjective: Chief Complaint  Patient presents with   Right Foot - Pain    HPI Pleasant 32 year old woman comes in today with right plantar foot pain.  Was seen in the emergency room where she told the x-rays were normal.  She does have a boot which helps but still  has pain in the heel that is only minimally improved Review of Systems  All other systems reviewed and are negative.    Objective: Vital Signs: LMP 04/05/2022   Physical Exam Constitutional:      Appearance: Normal appearance.  Pulmonary:     Effort: Pulmonary effort is normal.  Skin:    General: Skin is warm and dry.  Neurological:     Mental Status: She is alert.     Ortho Exam Examination of her right foot she does have some pes planus.  She has mild soft tissue swelling but no erythema her foot is warm with pulses intact.  Compartments are soft and compressible.  She does have some tenderness in the plantar surface of the heel which goes up and around to her Achilles.  Achilles is intact.  She has good strength with plantarflexion and dorsiflexion though plantarflexion is somewhat painful for her.  Good eversion inversion Specialty Comments:  No specialty comments available.  Imaging: No results found.   PMFS History: There are no problems to display for this patient.  History reviewed. No pertinent past medical history.  History reviewed. No pertinent family history.  History reviewed. No pertinent  surgical history. Social History   Occupational History   Not on file  Tobacco Use   Smoking status: Former    Types: Cigarettes   Smokeless tobacco: Never  Vaping Use   Vaping Use: Every day  Substance and Sexual Activity   Alcohol use: Yes    Comment: rarely   Drug use: Never   Sexual activity: Not on file

## 2022-05-06 ENCOUNTER — Telehealth: Payer: Self-pay

## 2022-05-06 ENCOUNTER — Telehealth: Payer: Self-pay | Admitting: Physician Assistant

## 2022-05-06 NOTE — Telephone Encounter (Signed)
FYI patient called triage phone stating her work doesn't really have "light duty" and she is coming home with increased ankle pain. I wrote her a letter out until she sees you again in 3 weeks.

## 2022-05-06 NOTE — Telephone Encounter (Signed)
Patient states she came in for her foot and she advised her employer told her she can't ware the boot at work she will not be able to use boot till she gets home and she can't afford to miss 3 weeks of work. Sending call to Triage.

## 2022-05-06 NOTE — Telephone Encounter (Signed)
I took care of this already

## 2022-05-07 ENCOUNTER — Telehealth: Payer: Self-pay | Admitting: Physician Assistant

## 2022-05-07 NOTE — Telephone Encounter (Signed)
Pt called stating that her job put her out of work. States she can not work with the boot and need a out of work letter sent to her [employer]. Please call pt at [phone]

## 2022-05-21 ENCOUNTER — Encounter: Payer: Self-pay | Admitting: Physician Assistant

## 2022-05-21 ENCOUNTER — Ambulatory Visit (INDEPENDENT_AMBULATORY_CARE_PROVIDER_SITE_OTHER): Payer: Medicaid Other | Admitting: Physician Assistant

## 2022-05-21 DIAGNOSIS — M722 Plantar fascial fibromatosis: Secondary | ICD-10-CM

## 2022-05-21 NOTE — Progress Notes (Signed)
   Office Visit Note   Patient: Robin Bradley           Date of Birth: Jul 29, 1990           MRN: [id number] Visit Date: 05/21/2022              Requested by: No referring provider defined for this encounter. PCP: Pcp, No  Chief Complaint  Patient presents with   Right Foot - Follow-up      HPI: Robin Bradley is a pleasant 32 year old woman who follows up on her right foot pain.  Findings were consistent with a plantar fascial partial rupture.  I placed her in a cam boot with an arch support.  She reports she is doing much better and would like to return to work  Assessment & Plan: Visit Diagnoses: Plantar fasciitis  Plan: I told her to return to work slowly she can call me if she has any difficulties should wear supportive shoe which she has already gotten.  Follow-Up Instructions: Return if symptoms worsen or fail to improve.   Ortho Exam  Patient is alert, oriented, no adenopathy, well-dressed, normal affect, normal respiratory effort. Right foot no swelling no tenderness sensation is intact no tenderness to deep palpation of the arch the posterior heel or the midfoot she is neurovascularly intact  Imaging: No results found. No images are attached to the encounter.  Labs: No results found for: "HGBA1C", "ESRSEDRATE", "CRP", "LABURIC", "REPTSTATUS", "GRAMSTAIN", "CULT", "LABORGA"   Lab Results  Component Value Date   ALBUMIN 3.4 (L) 04/25/2021    No results found for: "MG" No results found for: "VD25OH"  No results found for: "PREALBUMIN"    Latest Ref Rng & Units 04/25/2021    5:41 PM  CBC EXTENDED  WBC 4.0 - 10.5 K/uL 5.9   RBC 3.87 - 5.11 MIL/uL 3.71   Hemoglobin 12.0 - 15.0 g/dL 11.2   HCT 36.0 - 46.0 % 33.8   Platelets 150 - 400 K/uL 259   NEUT# 1.7 - 7.7 K/uL 3.4   Lymph# 0.7 - 4.0 K/uL 1.8      There is no height or weight on file to calculate BMI.  Orders:  No orders of the defined types were placed in this encounter.  No orders of the defined  types were placed in this encounter.    Procedures: No procedures performed  Clinical Data: No additional findings.  ROS:  All other systems negative, except as noted in the HPI. Review of Systems  Objective: Vital Signs: LMP 04/05/2022   Specialty Comments:  No specialty comments available.  PMFS History: Patient Active Problem List   Diagnosis Date Noted   Plantar fasciitis of right foot 04/30/2022   History reviewed. No pertinent past medical history.  History reviewed. No pertinent family history.  History reviewed. No pertinent surgical history. Social History   Occupational History   Not on file  Tobacco Use   Smoking status: Former    Types: Cigarettes   Smokeless tobacco: Never  Vaping Use   Vaping Use: Every day  Substance and Sexual Activity   Alcohol use: Yes    Comment: rarely   Drug use: Never   Sexual activity: Not on file

## 2022-06-20 ENCOUNTER — Telehealth: Payer: Self-pay | Admitting: Physician Assistant

## 2022-06-20 NOTE — Telephone Encounter (Signed)
Patient called advised [facility] [employer] will be faxing disability paperwork to [facility]'s attention.

## 2022-06-20 NOTE — Telephone Encounter (Signed)
[employer] forms received. To [employer] 

## 2022-07-28 ENCOUNTER — Encounter: Payer: Self-pay | Admitting: *Deleted

## 2023-02-17 ENCOUNTER — Encounter (HOSPITAL_BASED_OUTPATIENT_CLINIC_OR_DEPARTMENT_OTHER): Payer: Self-pay | Admitting: Emergency Medicine

## 2023-02-17 ENCOUNTER — Other Ambulatory Visit: Payer: Self-pay

## 2023-02-17 ENCOUNTER — Emergency Department (HOSPITAL_BASED_OUTPATIENT_CLINIC_OR_DEPARTMENT_OTHER)
Admission: EM | Admit: 2023-02-17 | Discharge: 2023-02-17 | Disposition: A | Discharge status: Home / Self Care | Payer: Medicaid Other | Attending: Emergency Medicine | Admitting: Emergency Medicine

## 2023-02-17 DIAGNOSIS — K0889 Other specified disorders of teeth and supporting structures: Secondary | ICD-10-CM | POA: Insufficient documentation

## 2023-02-17 MED ORDER — KETOROLAC TROMETHAMINE 15 MG/ML IJ SOLN
15.0000 mg | Freq: Once | INTRAMUSCULAR | Status: AC
  Administered 2023-02-17: 15 mg via INTRAMUSCULAR
  Filled 2023-02-17: qty 1

## 2023-02-17 MED ORDER — AMOXICILLIN 500 MG PO CAPS
1000.0000 mg | ORAL_CAPSULE | Freq: Once | ORAL | Status: AC
  Administered 2023-02-17: 1000 mg via ORAL
  Filled 2023-02-17: qty 2

## 2023-02-17 NOTE — ED Triage Notes (Signed)
Left top molar pain, states has been having problems for 1-2 years after wisdom teeth removed.

## 2023-02-17 NOTE — ED Provider Notes (Signed)
[place] [facility] Provider Note   CSN: [id number] Arrival date & time: 02/17/23  4098     History  Chief Complaint  Patient presents with   Dental Pain    Robin Bradley is a 32 y.o. female.  32 yo F with a chief complaint of left upper dental pain.  The patient unfortunately has struggled with her dentition in the past and has had multiple teeth removed from the left upper portion of her mouth.  Been bothering her for some time but worsening over the past week or so.  No fevers.  No difficulty swallowing.   Dental Pain      Home Medications Prior to Admission medications   Medication Sig Start Date End Date Taking? Authorizing Provider  cyclobenzaprine (FLEXERIL) 10 MG tablet Take 1 tablet (10 mg total) by mouth 2 (two) times daily as needed for muscle spasms. 02/02/22   Curatolo, Adam, DO  HYDROcodone-acetaminophen (NORCO/VICODIN) 5-325 MG tablet Take 1 tablet by mouth every 6 (six) hours as needed for severe pain. 06/13/21   Al Decant, PA-C  ibuprofen (ADVIL) 600 MG tablet Take 1 tablet (600 mg total) by mouth every 6 (six) hours as needed. 04/27/22   Carmel Sacramento A, PA-C  methylPREDNISolone (MEDROL DOSEPAK) 4 MG TBPK tablet Follow package insert 02/02/22   Curatolo, Adam, DO  metroNIDAZOLE (FLAGYL) 500 MG tablet Take 1 tablet (500 mg total) by mouth 2 (two) times daily. 04/25/21   Theron Arista, PA-C  naproxen (NAPROSYN) 375 MG tablet Take 1 tablet (375 mg total) by mouth 2 (two) times daily. 06/13/21   Al Decant, PA-C      Allergies    Patient has no known allergies.    Review of Systems   Review of Systems  Physical Exam Updated Vital Signs BP (!) 111/90 (BP Location: Right Arm)   Pulse 83   Temp 98.6 F (37 C) (Oral)   Resp 18   Ht 5\' 1"  (1.549 m)   Wt 86.2 kg   SpO2 100%   BMI 35.90 kg/m  Physical Exam Vitals and nursing note reviewed.  Constitutional:      General: She is not in acute  distress.    Appearance: She is well-developed. She is not diaphoretic.  HENT:     Head: Normocephalic.     Mouth/Throat:     Comments: Patient has an irregular gumline in the left upper portion of her mouth.  There is no obvious area of fluctuance or edema.  She is tolerating secretions out difficulty.  No trismus. Eyes:     Pupils: Pupils are equal, round, and reactive to light.  Cardiovascular:     Rate and Rhythm: Normal rate and regular rhythm.     Heart sounds: No murmur heard.    No friction rub. No gallop.  Pulmonary:     Effort: Pulmonary effort is normal.     Breath sounds: No wheezing or rales.  Abdominal:     General: There is no distension.     Palpations: Abdomen is soft.     Tenderness: There is no abdominal tenderness.  Musculoskeletal:        General: No tenderness.     Cervical back: Normal range of motion and neck supple.  Skin:    General: Skin is warm and dry.  Neurological:     Mental Status: She is alert and oriented to person, place, and time.  Psychiatric:  Behavior: Behavior normal.     ED Results / Procedures / Treatments   Labs (all labs ordered are listed, but only abnormal results are displayed) Labs Reviewed - No data to display  EKG None  Radiology No results found.  Procedures Procedures    Medications Ordered in ED Medications  amoxicillin (AMOXIL) capsule 1,000 mg (has no administration in time range)  ketorolac (TORADOL) 15 MG/ML injection 15 mg (has no administration in time range)    ED Course/ Medical Decision Making/ A&P                                 Medical Decision Making Risk Prescription drug management.   32 yo F with a chief complaint of left upper dental pain.  No obvious signs of infection.  Started on oral antibiotics.  Dentistry follow-up.  6:23 AM:  I have discussed the diagnosis/risks/treatment options with the patient.  Evaluation and diagnostic testing in the emergency department does not  suggest an emergent condition requiring admission or immediate intervention beyond what has been performed at this time.  They will follow up with Dentistry. We also discussed returning to the ED immediately if new or worsening sx occur. We discussed the sx which are most concerning (e.g., sudden worsening pain, fever, inability to tolerate by mouth) that necessitate immediate return. Medications administered to the patient during their visit and any new prescriptions provided to the patient are listed below.  Medications given during this visit Medications  amoxicillin (AMOXIL) capsule 1,000 mg (has no administration in time range)  ketorolac (TORADOL) 15 MG/ML injection 15 mg (has no administration in time range)     The patient appears reasonably screen and/or stabilized for discharge and I doubt any other medical condition or other [facility] requiring further screening, evaluation, or treatment in the ED at this time prior to discharge.          Final Clinical Impression(s) / ED Diagnoses Final diagnoses:  Pain, dental    Rx / DC Orders ED Discharge Orders     None         Melene Plan, DO 02/17/23 [phone]

## 2023-02-17 NOTE — Discharge Instructions (Signed)
Please try to find a dentist that can help you with this.  Max dosing of over-the-counter medications listed below.  Take 4 over the counter ibuprofen tablets 3 times a day or 2 over-the-counter naproxen tablets twice a day for pain. Also take tylenol 1000mg (2 extra strength) four times a day.

## 2023-06-18 ENCOUNTER — Encounter (HOSPITAL_BASED_OUTPATIENT_CLINIC_OR_DEPARTMENT_OTHER): Payer: Self-pay

## 2023-06-18 ENCOUNTER — Emergency Department (HOSPITAL_BASED_OUTPATIENT_CLINIC_OR_DEPARTMENT_OTHER)
Admission: EM | Admit: 2023-06-18 | Discharge: 2023-06-18 | Disposition: A | Discharge status: Home / Self Care | Attending: Emergency Medicine | Admitting: Emergency Medicine

## 2023-06-18 ENCOUNTER — Other Ambulatory Visit: Payer: Self-pay

## 2023-06-18 DIAGNOSIS — Z3491 Encounter for supervision of normal pregnancy, unspecified, first trimester: Secondary | ICD-10-CM

## 2023-06-18 DIAGNOSIS — Z3A09 9 weeks gestation of pregnancy: Secondary | ICD-10-CM | POA: Diagnosis not present

## 2023-06-18 DIAGNOSIS — O26891 Other specified pregnancy related conditions, first trimester: Secondary | ICD-10-CM | POA: Diagnosis present

## 2023-06-18 DIAGNOSIS — R5383 Other fatigue: Secondary | ICD-10-CM

## 2023-06-18 LAB — URINALYSIS, ROUTINE W REFLEX MICROSCOPIC
Bilirubin Urine: NEGATIVE
Glucose, UA: NEGATIVE mg/dL
Hgb urine dipstick: NEGATIVE
Ketones, ur: 15 mg/dL — AB
Nitrite: NEGATIVE
Protein, ur: NEGATIVE mg/dL
Specific Gravity, Urine: 1.025 (ref 1.005–1.030)
pH: 6 (ref 5.0–8.0)

## 2023-06-18 LAB — CBC
HCT: 34.8 % — ABNORMAL LOW (ref 36.0–46.0)
Hemoglobin: 11.6 g/dL — ABNORMAL LOW (ref 12.0–15.0)
MCH: 30.4 pg (ref 26.0–34.0)
MCHC: 33.3 g/dL (ref 30.0–36.0)
MCV: 91.3 fL (ref 80.0–100.0)
Platelets: 274 10*3/uL (ref 150–400)
RBC: 3.81 MIL/uL — ABNORMAL LOW (ref 3.87–5.11)
RDW: 14 % (ref 11.5–15.5)
WBC: 4.8 10*3/uL (ref 4.0–10.5)
nRBC: 0 % (ref 0.0–0.2)

## 2023-06-18 LAB — BASIC METABOLIC PANEL
Anion gap: 8 (ref 5–15)
BUN: 7 mg/dL (ref 6–20)
CO2: 20 mmol/L — ABNORMAL LOW (ref 22–32)
Calcium: 8.8 mg/dL — ABNORMAL LOW (ref 8.9–10.3)
Chloride: 104 mmol/L (ref 98–111)
Creatinine, Ser: 0.58 mg/dL (ref 0.44–1.00)
GFR, Estimated: >60 mL/min (ref >60)
Glucose, Bld: 98 mg/dL (ref 70–99)
Potassium: 3.9 mmol/L (ref 3.5–5.1)
Sodium: 132 mmol/L — ABNORMAL LOW (ref 135–145)

## 2023-06-18 LAB — URINALYSIS, MICROSCOPIC (REFLEX)

## 2023-06-18 LAB — HCG, QUANTITATIVE, PREGNANCY: hCG, Beta Chain, Quant, S: 61973 m[IU]/mL — ABNORMAL HIGH (ref <5)

## 2023-06-18 NOTE — ED Triage Notes (Signed)
 Patient arrives with complaints of worsening fatigue x1 month. Patient states that she is also ~[redacted] weeks pregnant, but she has not had her first OBGYN appt yet.  Concerned about worsening "anemia" symptoms.

## 2023-06-18 NOTE — ED Notes (Signed)
Reviewed discharge instructions with pt. Pt states understanding. Ambulatory at discharge

## 2023-06-18 NOTE — ED Provider Notes (Signed)
  [facility] AT [facility] Provider Note   CSN: [id number] Arrival date & time: 06/18/23  1131     History  Chief Complaint  Patient presents with   Fatigue   Suspected Pregnancy    Robin Bradley is a 33 y.o. female.  HPI 33 year old female presents with fatigue.  She states she is approximately [redacted] weeks pregnant (G4, P3) and ever since she found out she was pregnant on 2/9 she has been fatigued.  She thinks this is something more than the pregnancy and is worried about low iron.  No vaginal bleeding or abdominal pain.  No urinary symptoms.  No shortness of breath, chest pain, or significant headache.  She states she used to take iron pills but did not like how they made her feel.  Home Medications Prior to Admission medications   Medication Sig Start Date End Date Taking? Authorizing Provider  cyclobenzaprine (FLEXERIL) 10 MG tablet Take 1 tablet (10 mg total) by mouth 2 (two) times daily as needed for muscle spasms. 02/02/22   Curatolo, Adam, DO  HYDROcodone-acetaminophen (NORCO/VICODIN) 5-325 MG tablet Take 1 tablet by mouth every 6 (six) hours as needed for severe pain. 06/13/21   Al Decant, PA-C  ibuprofen (ADVIL) 600 MG tablet Take 1 tablet (600 mg total) by mouth every 6 (six) hours as needed. 04/27/22   Carmel Sacramento A, PA-C  methylPREDNISolone (MEDROL DOSEPAK) 4 MG TBPK tablet Follow package insert 02/02/22   Curatolo, Adam, DO  metroNIDAZOLE (FLAGYL) 500 MG tablet Take 1 tablet (500 mg total) by mouth 2 (two) times daily. 04/25/21   Theron Arista, PA-C  naproxen (NAPROSYN) 375 MG tablet Take 1 tablet (375 mg total) by mouth 2 (two) times daily. 06/13/21   Al Decant, PA-C      Allergies    Patient has no known allergies.    Review of Systems   Review of Systems  Constitutional:  Positive for fatigue.  Respiratory:  Negative for shortness of breath.   Cardiovascular:  Negative for chest pain.  Gastrointestinal:  Negative  for abdominal pain.  Genitourinary:  Negative for dysuria, pelvic pain and vaginal bleeding.    Physical Exam Updated Vital Signs BP 121/72 (BP Location: Left Arm)   Pulse 75   Temp 97.9 F (36.6 C) (Oral)   Resp 20   Ht 5\' 1"  (1.549 m)   Wt 90.7 kg   LMP 04/17/2023   SpO2 100%   BMI 37.79 kg/m  Physical Exam Vitals and nursing note reviewed.  Constitutional:      Appearance: She is well-developed.  HENT:     Head: Normocephalic and atraumatic.  Eyes:     Extraocular Movements: Extraocular movements intact.     Pupils: Pupils are equal, round, and reactive to light.  Cardiovascular:     Rate and Rhythm: Normal rate and regular rhythm.     Heart sounds: Normal heart sounds.  Pulmonary:     Effort: Pulmonary effort is normal.     Breath sounds: Normal breath sounds.  Abdominal:     Palpations: Abdomen is soft.     Tenderness: There is no abdominal tenderness.  Skin:    General: Skin is warm and dry.  Neurological:     Mental Status: She is alert.     Comments: CN 3-12 grossly intact. 5/5 strength in all 4 extremities. Grossly normal sensation. Normal finger to nose.      ED Results / Procedures / Treatments   Labs (  all labs ordered are listed, but only abnormal results are displayed) Labs Reviewed  BASIC METABOLIC PANEL - Abnormal; Notable for the following components:      Result Value   Sodium 132 (*)    CO2 20 (*)    Calcium 8.8 (*)    All other components within normal limits  CBC - Abnormal; Notable for the following components:   RBC 3.81 (*)    Hemoglobin 11.6 (*)    HCT 34.8 (*)    All other components within normal limits  URINALYSIS, ROUTINE W REFLEX MICROSCOPIC - Abnormal; Notable for the following components:   APPearance CLOUDY (*)    Ketones, ur 15 (*)    Leukocytes,Ua TRACE (*)    All other components within normal limits  HCG, QUANTITATIVE, PREGNANCY - Abnormal; Notable for the following components:   hCG, Beta Chain, Quant, S 91,478 (*)     All other components within normal limits  URINALYSIS, MICROSCOPIC (REFLEX) - Abnormal; Notable for the following components:   Bacteria, UA MANY (*)    All other components within normal limits  CBG MONITORING, ED    EKG None  Radiology No results found.  Procedures Procedures    Medications Ordered in ED Medications - No data to display  ED Course/ Medical Decision Making/ A&P                                 Medical Decision Making Amount and/or Complexity of Data Reviewed Labs: ordered.    Details: Mild anemia at 11.6, near baseline.   Patient has anemia but this is unchanged from her baseline.  Could be from pregnancy versus chronic mild anemia.  Vital signs are normal.  No chest symptoms.  I discussed we typically do not check iron levels in the emergency department so this was not obtained when she was in triage.  Offered that we could send these levels and she could follow-up with her OB/GYN on the results but patient declines.  I discussed that while her anemia is mild she could consider going back on her iron pills but she did not like how they made her feel so she declined.  She prefers no further workup and wants to be discharged.  From a pregnancy standpoint she has no bleeding, abdominal pain.  I have very low suspicion for ectopic pregnancy at this time.  Triage had sent a quantitative hCG but she has no specific pregnancy emergency complaint.  I did note there are some bacteria in her urine though also it is a contaminated.  I discussed that asymptomatic bacteriuria can be concerning in early pregnancy and offered to recheck her urine but she declines and wants to leave.        Final Clinical Impression(s) / ED Diagnoses Final diagnoses:  Fatigue, unspecified type  First trimester pregnancy    Rx / DC Orders ED Discharge Orders     None         Pricilla Loveless, MD 06/18/23 1415

## 2023-06-18 NOTE — Discharge Instructions (Signed)
 If you develop abdominal pain, vaginal bleeding, burning with urination, fever, new or worsening fatigue, or any other new/concerning symptoms then return to the ER.

## 2023-09-08 IMAGING — CT CT ABD-PELV W/ CM
2 of 4 series · 16 of 46 positions shown, 18 images · IV contrast (Omnipaque)
Comparison: None.

CLINICAL DATA: Right lower quadrant abdominal pain beginning 1 week
ago.

EXAM:
CT ABDOMEN AND PELVIS WITH CONTRAST
TECHNIQUE: Multidetector CT imaging of the abdomen and pelvis was performed
using the standard protocol following bolus administration of
intravenous contrast.
CONTRAST:  100mL OMNIPAQUE IOHEXOL 300 MG/ML  SOLN

[Series 2: axial st · axial · 0.76mm/px · z∈[-679,-304]mm · 13 of 83 slices shown, 15 images]
[im 4/83  soft-tissue]
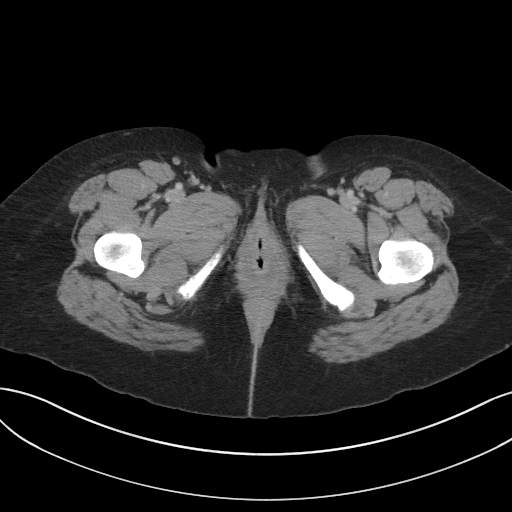
[im 4/83  bone]
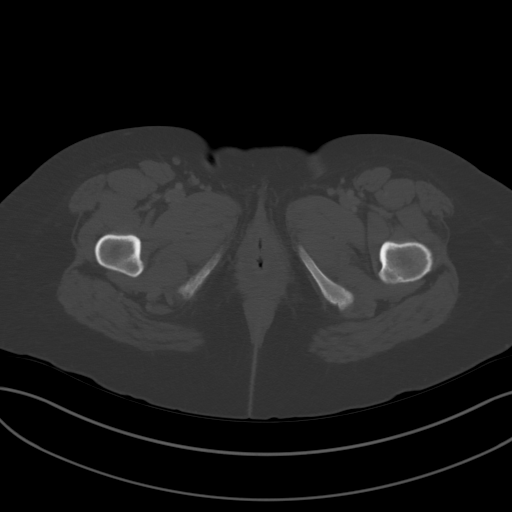
[im 10/83  soft-tissue]
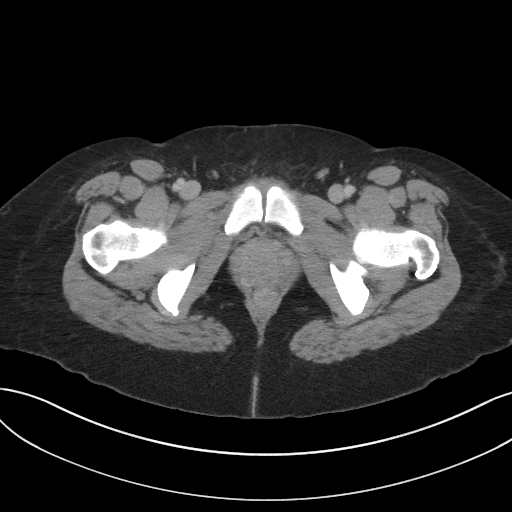
[im 17/83  soft-tissue]
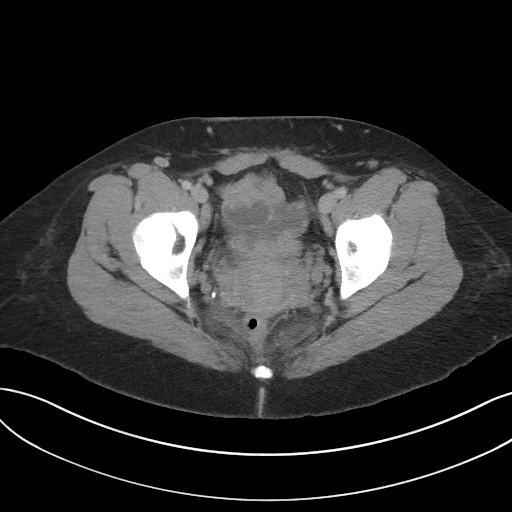
[im 23/83  soft-tissue]
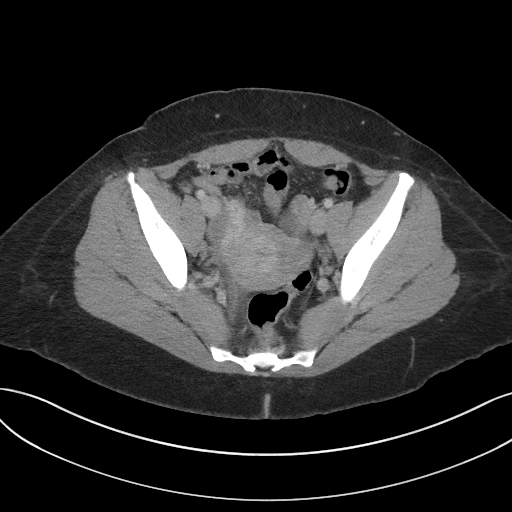
[im 30/83  soft-tissue]
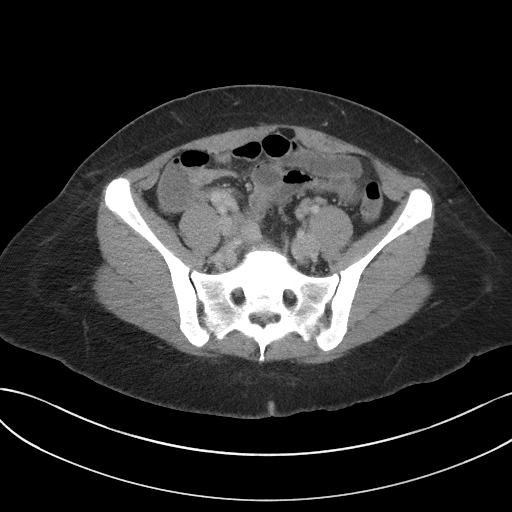
[im 37/83  soft-tissue]
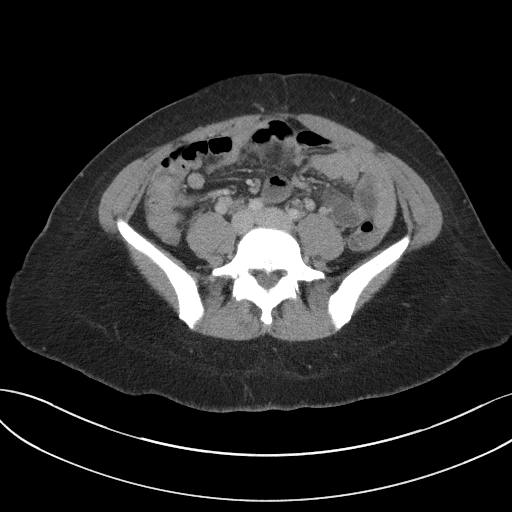
[im 43/83  soft-tissue]
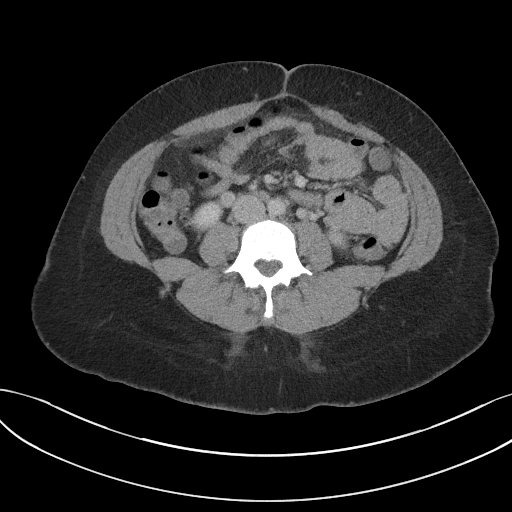
[im 46/83  soft-tissue]
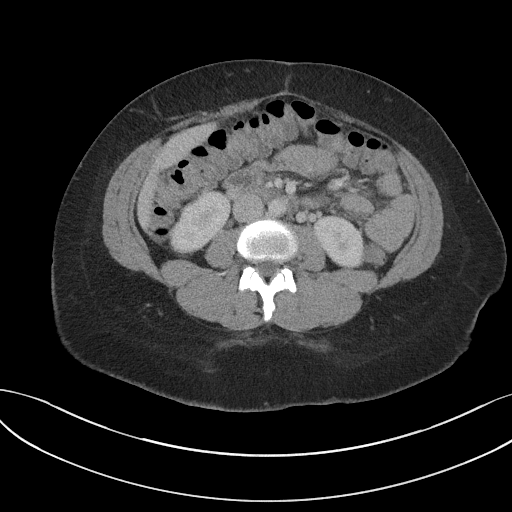
[im 53/83  soft-tissue]
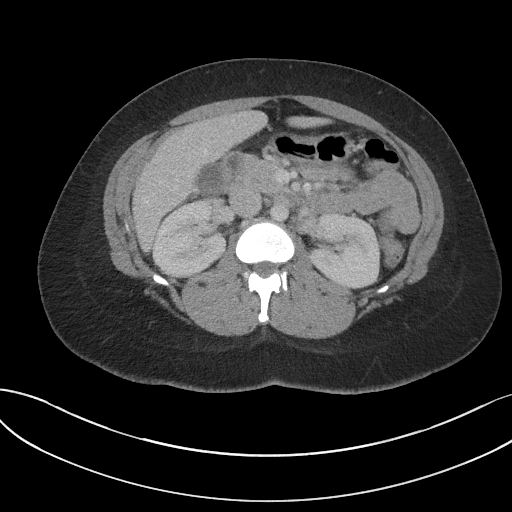
[im 53/83  bone]
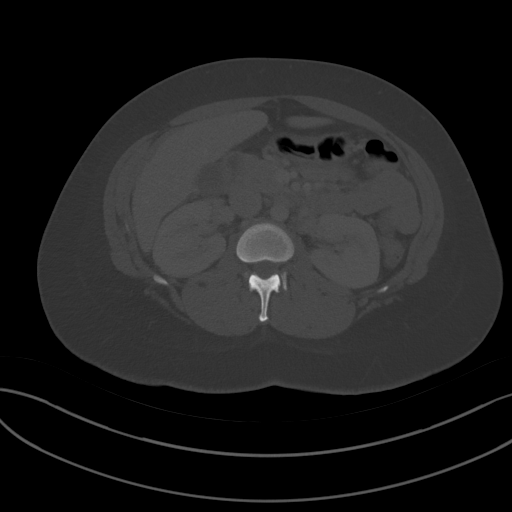
[im 60/83  soft-tissue]
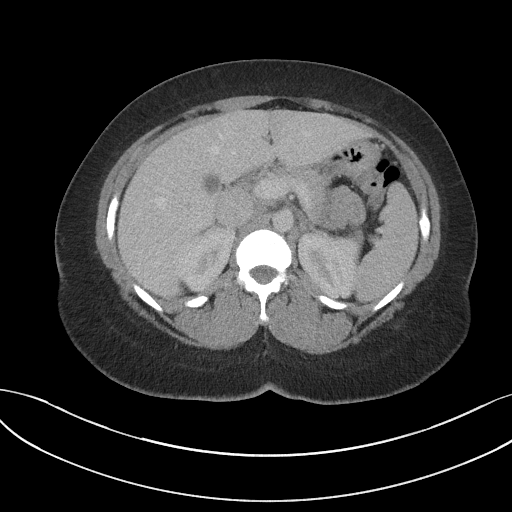
[im 66/83  soft-tissue]
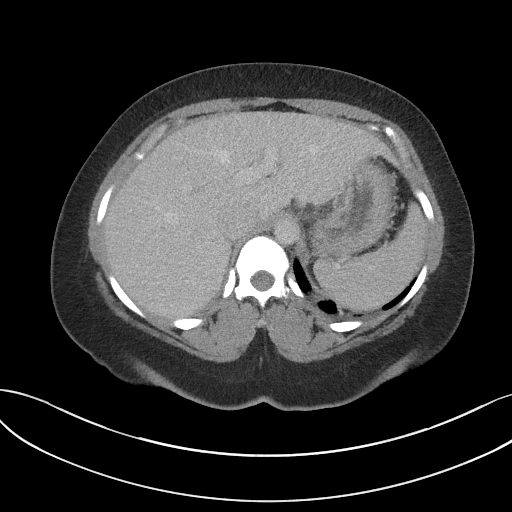
[im 73/83  soft-tissue]
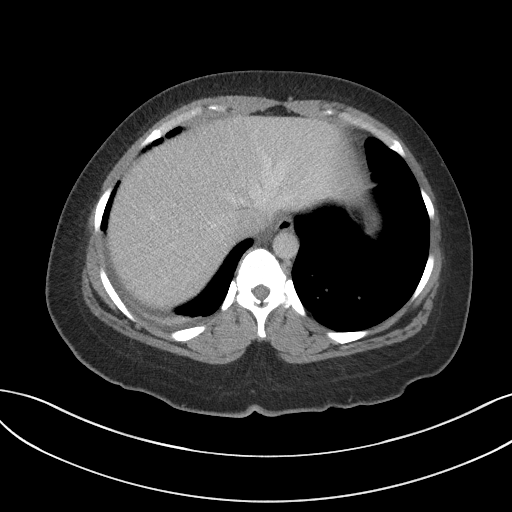
[im 79/83  soft-tissue]
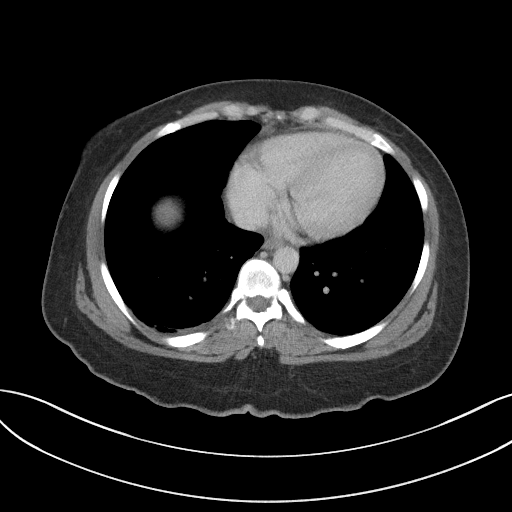

[Series 5: coronal st · coronal · 0.66mm/px · 3 of 87 slices shown]
[im 29/87  soft-tissue]
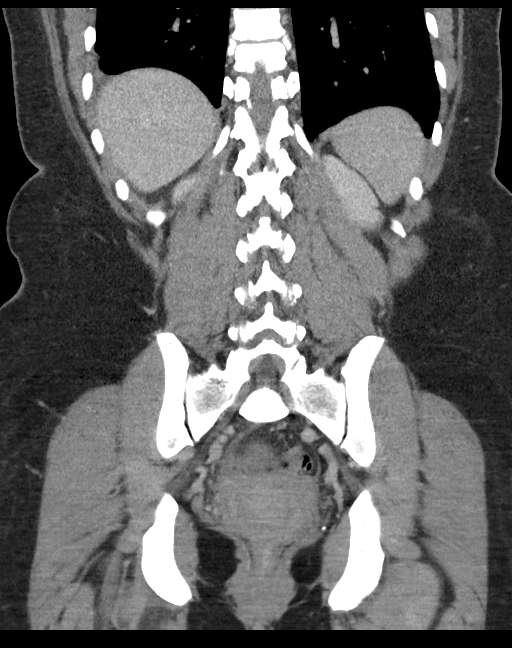
[im 39/87  soft-tissue]
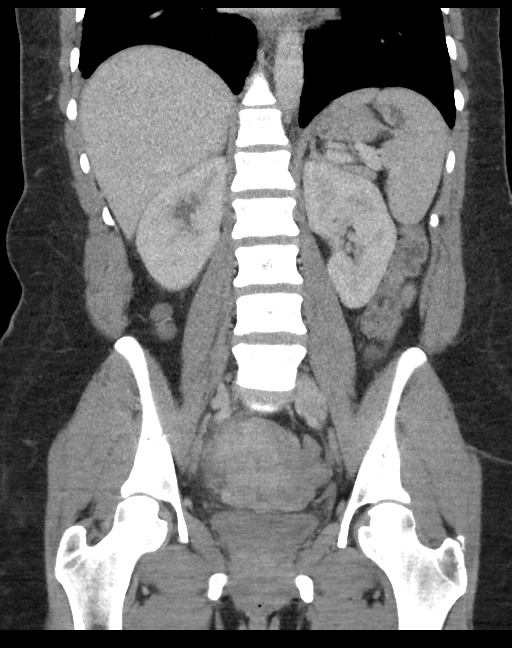
[im 48/87  soft-tissue]
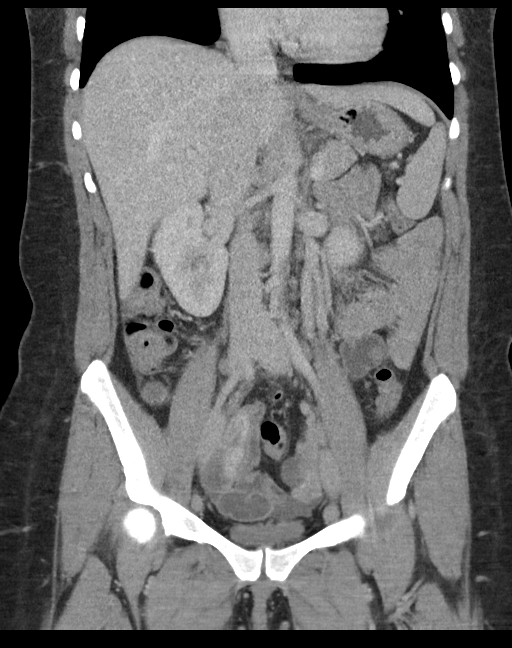

[16 of 46 positions shown; findings below may reference images not displayed]

FINDINGS: Lower chest: Small right pleural effusion with mild right base
atelectasis, etiology unknown.

Hepatobiliary: Normal

Pancreas: Normal

Spleen: Normal

Adrenals/Urinary Tract: Adrenal glands are normal. Kidneys are
normal. Bladder is normal.

Stomach/Bowel: Stomach and small intestine are normal. The appendix
is normal. No colon abnormality seen.

Vascular/Lymphatic: No abnormal vascular finding.  No adenopathy.

Reproductive: Uterus appears normal. Prominent vascularity of the
ovaries, particularly on the right, suggesting possible pelvic
congestion syndrome.

Other: Small amount of free fluid in the pelvic cul-de-sac, often
seen in females of this age.

Musculoskeletal: Normal
IMPRESSION: Normal appearing appendix.

Prominent vascularity of the adnexal regions, particularly the
right, suggesting pelvic congestion syndrome.

Small right pleural effusion with mild right base atelectasis,
etiology unknown.

## 2023-10-27 IMAGING — CR DG HIP (WITH OR WITHOUT PELVIS) 2-3V*L*
3 series · 3 of 3 positions shown · non-contrast
Comparison: None.

CLINICAL DATA: Left lateral hip pain for the past 3 days.

EXAM:
DG HIP (WITH OR WITHOUT PELVIS) 2-3V LEFT

[t hip ap left]
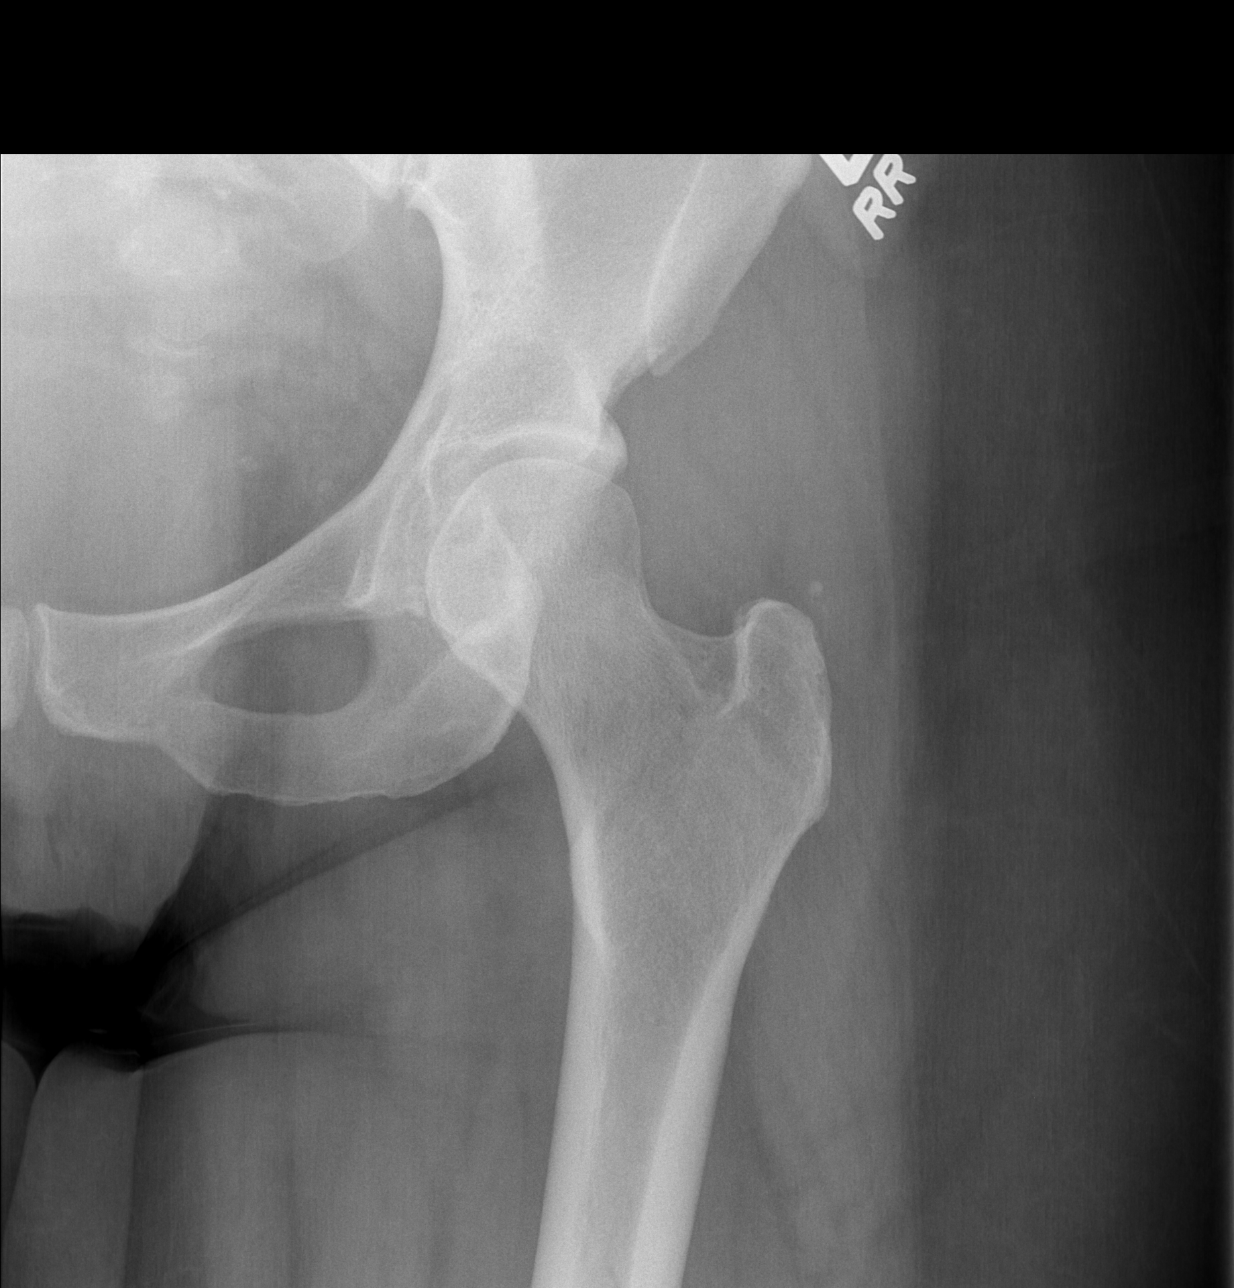

[t hip frog leg left]
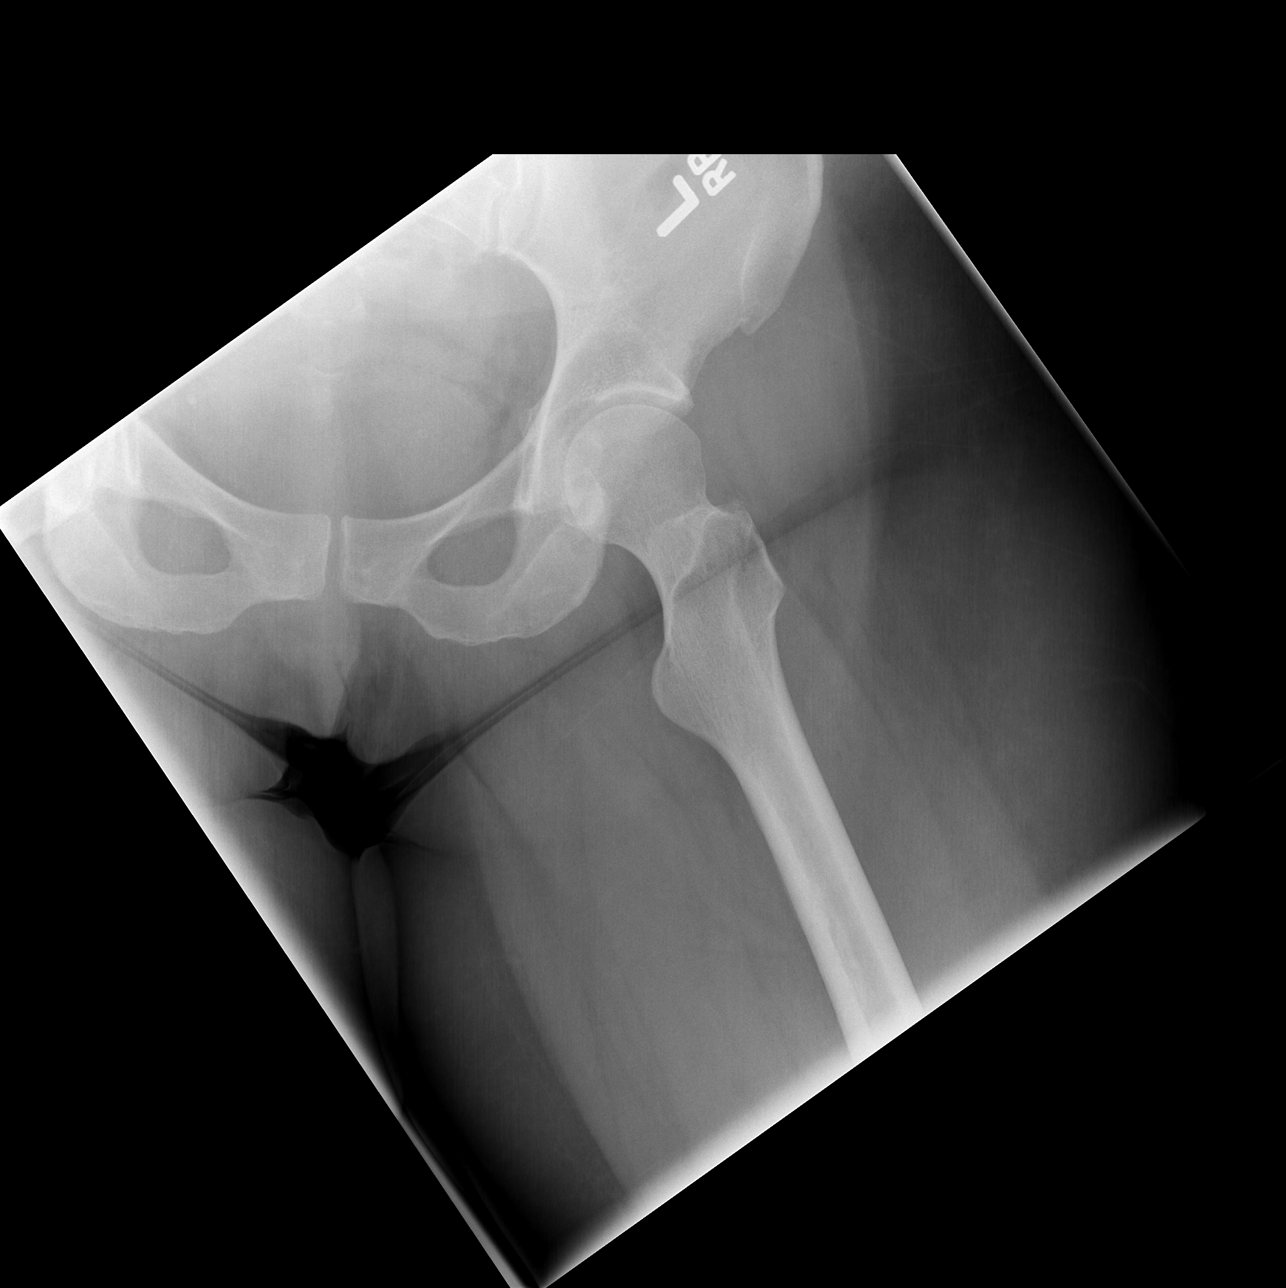

[t pelvis a.p.]
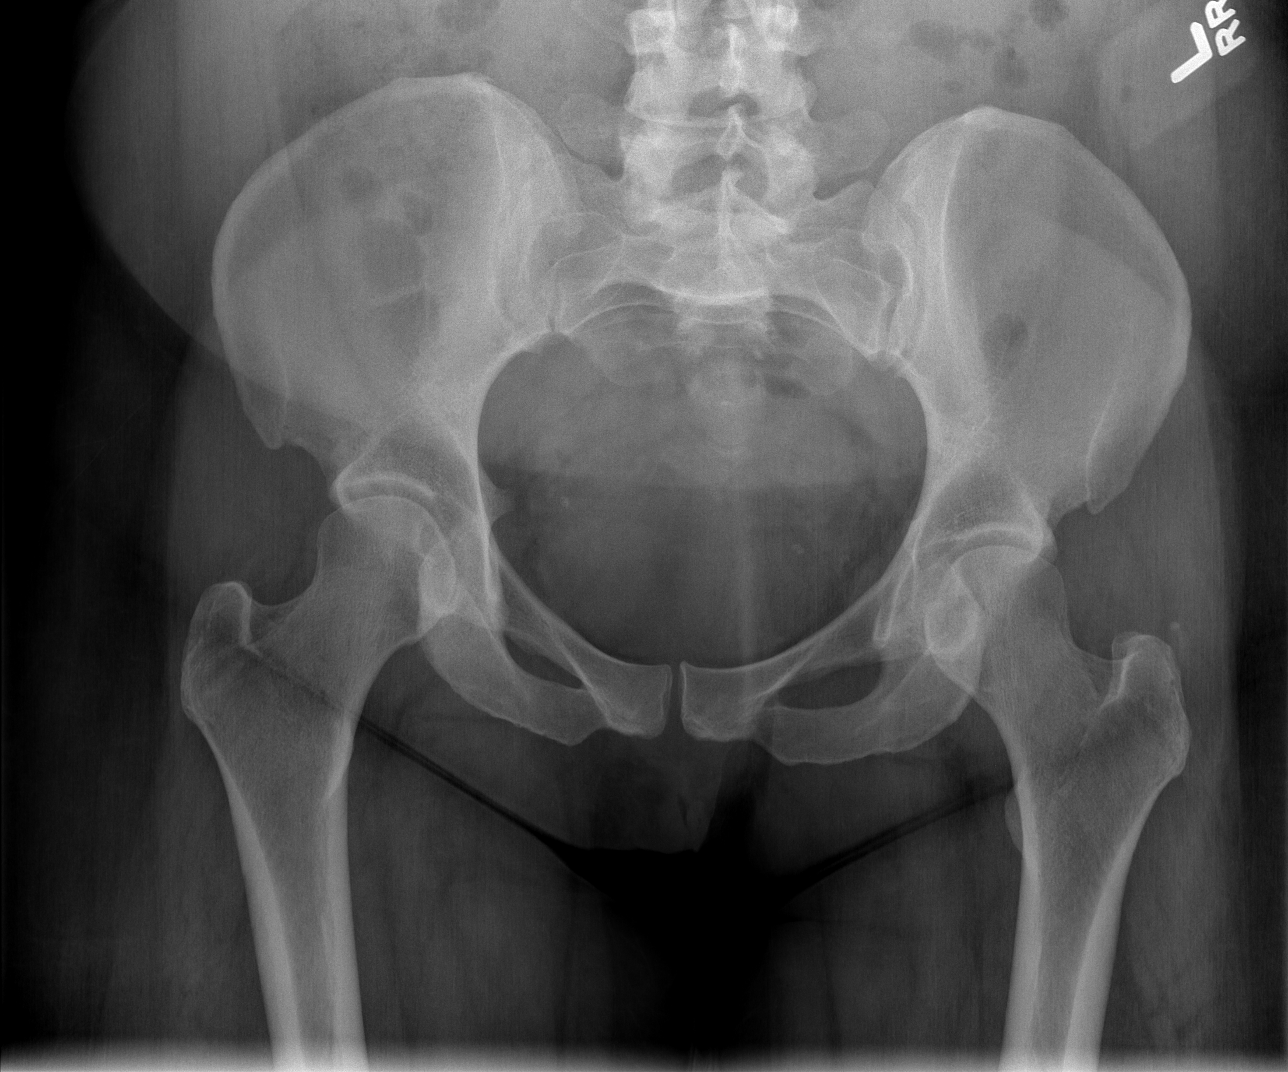

[3 of 3 positions shown; findings below may reference images not displayed]

FINDINGS: There is no evidence of hip fracture or dislocation. There is no
evidence of arthropathy or other focal bone abnormality. Small round
calcification adjacent to the greater tuberosity.
IMPRESSION: 1. No acute osseous abnormality or significant degenerative changes.
2. Small calcification adjacent to the greater tuberosity could
reflect gluteal calcific tendinopathy, which can be a source of
pain.
